# Patient Record
Sex: Female | Born: 1985 | Race: White | Hispanic: No | Smoking: Never smoker
Health system: Southern US, Community
[De-identification: ages and names within clinical notes are randomized; demographics above are authoritative.]

## PROBLEM LIST (undated history)

## (undated) ENCOUNTER — Inpatient Hospital Stay (HOSPITAL_COMMUNITY): Payer: Self-pay

## (undated) DIAGNOSIS — D6851 Activated protein C resistance: Secondary | ICD-10-CM

## (undated) DIAGNOSIS — J45909 Unspecified asthma, uncomplicated: Secondary | ICD-10-CM

## (undated) DIAGNOSIS — T7840XA Allergy, unspecified, initial encounter: Secondary | ICD-10-CM

## (undated) DIAGNOSIS — E282 Polycystic ovarian syndrome: Secondary | ICD-10-CM

## (undated) DIAGNOSIS — Z1371 Encounter for nonprocreative screening for genetic disease carrier status: Secondary | ICD-10-CM

## (undated) HISTORY — DX: Allergy, unspecified, initial encounter: T78.40XA

## (undated) HISTORY — DX: Polycystic ovarian syndrome: E28.2

## (undated) HISTORY — DX: Unspecified asthma, uncomplicated: J45.909

## (undated) HISTORY — PX: INGUINAL HERNIA REPAIR: SUR1180

## (undated) HISTORY — DX: Activated protein C resistance: D68.51

## (undated) HISTORY — DX: Encounter for nonprocreative screening for genetic disease carrier status: Z13.71

---

## 2017-02-05 DIAGNOSIS — C50919 Malignant neoplasm of unspecified site of unspecified female breast: Secondary | ICD-10-CM

## 2017-02-05 HISTORY — DX: Malignant neoplasm of unspecified site of unspecified female breast: C50.919

## 2017-02-05 HISTORY — PX: COMPLETE MASTECTOMY W/ SENTINEL NODE BIOPSY: SHX1383

## 2017-09-17 ENCOUNTER — Encounter: Payer: Self-pay | Admitting: Obstetrics and Gynecology

## 2017-09-17 ENCOUNTER — Ambulatory Visit (INDEPENDENT_AMBULATORY_CARE_PROVIDER_SITE_OTHER): Payer: BLUE CROSS/BLUE SHIELD | Admitting: Obstetrics and Gynecology

## 2017-09-17 VITALS — BP 119/86 | HR 72 | Wt 138.0 lb

## 2017-09-17 DIAGNOSIS — Z8742 Personal history of other diseases of the female genital tract: Secondary | ICD-10-CM

## 2017-09-17 DIAGNOSIS — N6452 Nipple discharge: Secondary | ICD-10-CM

## 2017-09-17 DIAGNOSIS — N6325 Unspecified lump in the left breast, overlapping quadrants: Secondary | ICD-10-CM

## 2017-09-17 DIAGNOSIS — Z1151 Encounter for screening for human papillomavirus (HPV): Secondary | ICD-10-CM

## 2017-09-17 DIAGNOSIS — Z01419 Encounter for gynecological examination (general) (routine) without abnormal findings: Secondary | ICD-10-CM

## 2017-09-17 DIAGNOSIS — Z124 Encounter for screening for malignant neoplasm of cervix: Secondary | ICD-10-CM

## 2017-09-17 DIAGNOSIS — N6320 Unspecified lump in the left breast, unspecified quadrant: Secondary | ICD-10-CM

## 2017-09-17 HISTORY — DX: Personal history of other diseases of the female genital tract: Z87.42

## 2017-09-17 HISTORY — DX: Unspecified lump in the left breast, overlapping quadrants: N63.25

## 2017-09-17 HISTORY — DX: Nipple discharge: N64.52

## 2017-09-17 NOTE — Progress Notes (Signed)
Obstetrics and Gynecology New Patient Annual Exam Evaluation  Appointment Date: 09/17/2017  OBGYN Clinic: [facility] for [facility]  Primary Care Provider: None  Referring Provider: Self  Chief Complaint:  Chief Complaint  Patient presents with  . New Gyn    Breast Leaking     History of Present Illness: Sandy Sullivan is a 32 y.o. Caucasian G0 (Patient's last menstrual period was 08/11/2017.), seen for the above chief complaint. Her past medical history is significant for PCOS, FVL heterozygote  GYN: no particular questions or issues today except below.   PCOS: patient wondering about getting a fasting insulin level and lipid panel b/c she had some high cholesterol in the past and pre-diabetes  Left breast: about 2 months ago she noticed her left breast had bloody discharge. No prior h/o of this and she only actually saw it bleeding yesterday. Also recently she noticed a lump on that right breast just above the nipple. She isn't sure if it's only coming from one duct, she denies any trauma to the area and it doesn't really sound tender or painful  She also occasionally has noticed sometimes she has a HA on the left temple and wonders if it could be due to jaw issues.  No chest pain, SOB, nausea, vomiting, abdominal pain, dysuria, hematuria, vaginal itching, dyspareunia except sometimes half way through her cycle, diarrhea, constipation, blood in BMs  Review of Systems:  as noted in the History of Present Illness.  Past Medical History:  Past Medical History:  Diagnosis Date  . Factor 5 Leiden mutation, heterozygous ([place])   . PCOS (polycystic ovarian syndrome)     Past Surgical History:  Past Surgical History:  Procedure Laterality Date  . INGUINAL HERNIA REPAIR Bilateral     Past Obstetrical History:  OB History  Gravida Para Term Preterm AB Living  0 0 0 0 0 0  SAB TAB Ectopic Multiple Live Births  0 0 0 0 0    Past Gynecological History: As  per HPI. Periods: qmonth, regular, 5 days, not particularly heavy or painful. Prior to this year she would only have a period 2x/year History of Pap Smear(s): Yes.   Last pap ?3 years ago, which was negative and no h/o abnormals History of STI(s): No. She is currently using condoms for contraception.   Social History:  Social History   Socioeconomic History  . Marital status: Significant Other    Spouse name: Not on file  . Number of children: Not on file  . Years of education: Not on file  . Highest education level: Not on file  Occupational History  . Not on file  Social Needs  . Financial resource strain: Not on file  . Food insecurity:    Worry: Not on file    Inability: Not on file  . Transportation needs:    Medical: Not on file    Non-medical: Not on file  Tobacco Use  . Smoking status: Never Smoker  . Smokeless tobacco: Never Used  Substance and Sexual Activity  . Alcohol use: Never    Frequency: Never  . Drug use: Never  . Sexual activity: Yes    Birth control/protection: Condom  Lifestyle  . Physical activity:    Days per week: Not on file    Minutes per session: Not on file  . Stress: Not on file  Relationships  . Social connections:    Talks on phone: Not on file    Gets together: Not on file  Attends religious service: Not on file    Active member of club or organization: Not on file    Attends meetings of clubs or organizations: Not on file    Relationship status: Not on file  . Intimate partner violence:    Fear of current or ex partner: Not on file    Emotionally abused: Not on file    Physically abused: Not on file    Forced sexual activity: Not on file  Other Topics Concern  . Not on file  Social History Narrative  . Not on file    Family History: History reviewed. No pertinent family history. She denies any female cancers except what sounds like breast cancer in her grandmother's sisters  Medications Damisha Castellanos had no  medications administered during this visit. No current outpatient medications on file.   No current facility-administered medications for this visit.     Allergies Aspirin   Physical Exam:  BP 119/86   Pulse 72   Wt 138 lb (62.6 kg)   LMP 08/11/2017  There is no height or weight on file to calculate BMI. General appearance: Well nourished, well developed female in no acute distress.  Neck:  Supple, normal appearance, and no thyromegaly  Cardiovascular: normal s1 and s2.  No murmurs, rubs or gallops. Respiratory:  Clear to auscultation bilateral. Normal respiratory effort Abdomen: positive bowel sounds and no masses, hernias; diffusely non tender to palpation, non distended Breasts: left breast normal except for only expression of the nipple I was able to express dark/wine colored blood from one duct at the 11 o'clock position of the nipple itself. At the 12 o'clock position approx 2-3cm above the nipple and just adjacent to the areola was a 5-12mm circumscribed, mobile, nttp lump.  Right breast exam and axilia: normal Neuro/Psych:  Normal mood and affect.  Skin:  Warm and dry.  Lymphatic:  No inguinal lymphadenopathy.   Pelvic exam: is not limited by body habitus EGBUS: within normal limits, Vagina: within normal limits and with no blood or discharge in the vault, Cervix: normal appearing cervix without tenderness, discharge or lesions. Uterus:  nonenlarged and non tender and Adnexa:  normal adnexa and no mass, fullness, tenderness Rectovaginal: deferred  Laboratory: none  Radiology: none  Assessment: pt stable  Plan:  1. History of PCOS D/w her re: GYN and non GYN issues with PCOS. Pt would like to get fasting insulin level and will come back for lab only visit. D/w her that if periods become irregular again to let us know as is risk for uterine hyperplasia. 73m d/w patient  2. Encounter for gynecological examination Routine care, pap done. Pt declines STI testing. Okay with  just condoms for [facility]   3. Bloody discharge from left nipple Will order basic labs and get a mammo and u/s of the left breast. 49m d/w patient   4. Breast lump on left side at 12 o'clock position  Orders Placed This Encounter  Procedures  . MM DIAG BREAST TOMO UNI LEFT  . US BREAST COMPLETE UNI LEFT INC AXILLA  . TSH  . Prolactin  . CMP and Liver  . Beta hCG quant (ref lab)  . Insulin, random  . Lipid panel  . Hemoglobin A1c  . CBC    RTC PRN  Durene Romans MD Attending Center for [facility])

## 2017-09-17 NOTE — Progress Notes (Signed)
Left breast leaking x 30month

## 2017-09-18 ENCOUNTER — Other Ambulatory Visit: Payer: BLUE CROSS/BLUE SHIELD

## 2017-09-18 ENCOUNTER — Encounter: Payer: Self-pay | Admitting: Obstetrics and Gynecology

## 2017-09-18 DIAGNOSIS — N6452 Nipple discharge: Secondary | ICD-10-CM

## 2017-09-18 NOTE — Addendum Note (Signed)
Addended by: Phillip Heal, DEMETRICE A on: 09/18/2017 10:22 AM   Modules accepted: Orders

## 2017-09-19 ENCOUNTER — Other Ambulatory Visit: Payer: Self-pay | Admitting: Radiology

## 2017-09-19 HISTORY — PX: BREAST BIOPSY: SHX20

## 2017-09-19 LAB — INSULIN, RANDOM: INSULIN: 4.4 u[IU]/mL (ref 2.6–24.9)

## 2017-09-19 LAB — LIPID PANEL
Chol/HDL Ratio: 3.3 ratio (ref 0.0–4.4)
Cholesterol, Total: 177 mg/dL (ref 100–199)
HDL: 54 mg/dL (ref >39)
LDL Calculated: 109 mg/dL — ABNORMAL HIGH (ref 0–99)
TRIGLYCERIDES: 70 mg/dL (ref 0–149)
VLDL Cholesterol Cal: 14 mg/dL (ref 5–40)

## 2017-09-19 LAB — CBC
HEMOGLOBIN: 14.3 g/dL (ref 11.1–15.9)
Hematocrit: 42.4 % (ref 34.0–46.6)
MCH: 31.5 pg (ref 26.6–33.0)
MCHC: 33.7 g/dL (ref 31.5–35.7)
MCV: 93 fL (ref 79–97)
Platelets: 240 10*3/uL (ref 150–450)
RBC: 4.54 x10E6/uL (ref 3.77–5.28)
RDW: 12.8 % (ref 12.3–15.4)
WBC: 6.2 10*3/uL (ref 3.4–10.8)

## 2017-09-19 LAB — CYTOLOGY - PAP
Adequacy: ABSENT
Diagnosis: NEGATIVE
HPV (WINDOPATH): NOT DETECTED

## 2017-09-19 LAB — CMP AND LIVER
ALK PHOS: 50 IU/L (ref 39–117)
ALT: 12 IU/L (ref 0–32)
AST: 13 IU/L (ref 0–40)
Albumin: 4.5 g/dL (ref 3.5–5.5)
BUN: 13 mg/dL (ref 6–20)
Bilirubin Total: 0.7 mg/dL (ref 0.0–1.2)
Bilirubin, Direct: 0.19 mg/dL (ref 0.00–0.40)
CO2: 24 mmol/L (ref 20–29)
Calcium: 9.1 mg/dL (ref 8.7–10.2)
Chloride: 104 mmol/L (ref 96–106)
Creatinine, Ser: 0.67 mg/dL (ref 0.57–1.00)
GFR calc Af Amer: 136 mL/min/{1.73_m2} (ref >59)
GFR calc non Af Amer: 118 mL/min/{1.73_m2} (ref >59)
Glucose: 89 mg/dL (ref 65–99)
Potassium: 4.1 mmol/L (ref 3.5–5.2)
Sodium: 140 mmol/L (ref 134–144)
Total Protein: 7 g/dL (ref 6.0–8.5)

## 2017-09-19 LAB — HEMOGLOBIN A1C
Est. average glucose Bld gHb Est-mCnc: 108 mg/dL
Hgb A1c MFr Bld: 5.4 % (ref 4.8–5.6)

## 2017-09-19 LAB — BETA HCG QUANT (REF LAB)

## 2017-09-19 LAB — PROLACTIN: PROLACTIN: 22.4 ng/mL (ref 4.8–23.3)

## 2017-09-19 LAB — TSH: TSH: 1.58 u[IU]/mL (ref 0.450–4.500)

## 2017-09-20 ENCOUNTER — Telehealth: Payer: Self-pay | Admitting: Obstetrics and Gynecology

## 2017-09-20 DIAGNOSIS — D0512 Intraductal carcinoma in situ of left breast: Secondary | ICD-10-CM

## 2017-09-20 DIAGNOSIS — Z86000 Personal history of in-situ neoplasm of breast: Secondary | ICD-10-CM | POA: Insufficient documentation

## 2017-09-20 NOTE — Telephone Encounter (Signed)
GYN Telephone Note Patient called at [phone] and VM identified it as her. VM left for her to call the clinic. I'm on call today and I told her she can call the on call phone at any time.  Durene Romans MD Attending Center for [facility] (Faculty Practice) 09/20/2017 Time: [phone]

## 2017-09-20 NOTE — Telephone Encounter (Signed)
GYN Telephone Note Patient called me back and she heard back re: her pathology results already. She lives in [place] and doesn't have a preference for site of care. I told her that I recommend being seen at [facility] or at [facility] in [place], just based on who can see her first. She stated that the radiology mentioned an MRI and I told her the best thing is to get her set up with a breast surgeon asap and they can decide on what next steps are. I told her that if they want any imaging or blood work before they see her then we can do that. Pt states she has open availability and will be back on Sunday 8/18.   Durene Romans MD Attending Center for [facility] (Faculty Practice) 09/20/2017 Time: [phone]

## 2017-09-23 ENCOUNTER — Other Ambulatory Visit: Payer: Self-pay | Admitting: Radiology

## 2017-09-23 ENCOUNTER — Telehealth: Payer: Self-pay

## 2017-09-23 DIAGNOSIS — N63 Unspecified lump in unspecified breast: Secondary | ICD-10-CM

## 2017-09-23 DIAGNOSIS — D0512 Intraductal carcinoma in situ of left breast: Secondary | ICD-10-CM

## 2017-09-23 NOTE — Telephone Encounter (Signed)
Patient has been informed of test results and referral has been placed to breast surgeon Dr.Burnett

## 2017-09-24 ENCOUNTER — Ambulatory Visit (INDEPENDENT_AMBULATORY_CARE_PROVIDER_SITE_OTHER): Payer: BLUE CROSS/BLUE SHIELD | Admitting: General Surgery

## 2017-09-24 ENCOUNTER — Encounter: Payer: Self-pay | Admitting: General Surgery

## 2017-09-24 VITALS — BP 106/60 | HR 73 | Resp 14 | Ht 64.0 in | Wt 136.0 lb

## 2017-09-24 DIAGNOSIS — D0512 Intraductal carcinoma in situ of left breast: Secondary | ICD-10-CM

## 2017-09-24 NOTE — Patient Instructions (Signed)
The patient is aware to call back for any questions or new concerns.  

## 2017-09-24 NOTE — Progress Notes (Signed)
Patient ID: Sandy Sullivan, female   DOB: 08/05/1985, 32 y.o.   MRN: [id number]  Chief Complaint  Patient presents with  . Follow-up    HPI Sandy Sullivan is a 32 y.o. female.  who presents for a breast evaluation referred by Dr. Ilda Basset. The most recent mammogram was done on 09-18-17.  Left breast ultrasound and biopsy was 09-19-17. Patient does not perform regular self breast checks. She states she had bloody discharge for about a month but didn't realize it was from her nipple until Monday 09-16-17. It was then that she felt the lump as well. Denies any breast injury or trauma. 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 She is here with her significant other of 7 years, [name].  MRI breast scheduled for tomorrow.  HPI  Past Medical History:  Diagnosis Date  . Allergy   . Asthma    exercised inducted  . Factor 5 Leiden mutation, heterozygous ([place])   . PCOS (polycystic ovarian syndrome)     Past Surgical History:  Procedure Laterality Date  . BREAST BIOPSY Left 09/19/2017   DUCTAL CARCINOMA IN SITU WITH CALCIFICATIONS  . INGUINAL HERNIA REPAIR Bilateral    child    Family History  Problem Relation Age of Onset  . Breast cancer Maternal Aunt        [employer]  . Colon cancer Neg Hx     Social History Social History   Tobacco Use  . Smoking status: Never Smoker  . Smokeless tobacco: Never Used  Substance Use Topics  . Alcohol use: Never    Frequency: Never  . Drug use: Never    Allergies  Allergen Reactions  . Sulfa Antibiotics Hives and Swelling  . Aspirin Hives  . Ibuprofen Hives    No current outpatient medications on file.   No current facility-administered medications for this visit.     Review of Systems Review of Systems  Constitutional: Negative.   Respiratory: Negative.   Cardiovascular: Negative.     Blood pressure 106/60, pulse 73, resp. rate 14, height '5\' 4"'$  (1.626 m), weight 136 lb (61.7 kg), last menstrual period 08/11/2017, SpO2 99  %.  Physical Exam Physical Exam  Constitutional: She is oriented to person, place, and time. She appears well-developed and well-nourished.  HENT:  Mouth/Throat: Oropharynx is clear and moist.  Eyes: Conjunctivae are normal. No scleral icterus.  Neck: Neck supple.  Cardiovascular: Normal rate, regular rhythm and normal heart sounds.  Pulmonary/Chest: Effort normal and breath sounds normal. Right breast exhibits no inverted nipple, no mass, no nipple discharge, no skin change and no tenderness. Left breast exhibits skin change. Left breast exhibits no inverted nipple, no mass, no nipple discharge and no tenderness.  Bilateral nipple crease. Bruising at biopsy site left breast.    Lymphadenopathy:    She has no cervical adenopathy.    She has no axillary adenopathy.  Neurological: She is alert and oriented to person, place, and time.  Skin: Skin is warm and dry.  Psychiatric: Her behavior is normal.    Data Reviewed  Bilateral diagnostic mammograms and left breast ultrasound dated September 18, 2017 were completed at [facility] in [place] and are not available for review today.  A disc been requested.  Radiologist does describe a 9 mm irregular mass in the left breast in the upper middle aspect.  Diagnosis Breast, left, needle core biopsy - DUCTAL CARCINOMA IN SITU WITH CALCIFICATIONS. - SEE COMMENT. PROGNOSTIC INDICATORS Results: IMMUNOHISTOCHEMICAL AND MORPHOMETRIC ANALYSIS PERFORMED MANUALLY Estrogen Receptor:  100%, POSITIVE, STRONG STAINING INTENSITY Progesterone Receptor: 80%, POSITIVE, STRONG STAINING INTENSITY FINAL DIAGNOSIS Mammograms and ultrasound were completed at [facility] Microscopic Comment The carcinoma appears high grade and may be involving an underlying papillary lesion.    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      Plan    The finding of DCIS in the papilloma is not particularly atypical, although the patient is quite  young.  She has been scheduled for an MRI tomorrow, but has asked to postpone this until after 1 September when her insurance changes and she thinks it will be less expensive.  I see no risk associated with this move.  Based on her young age, I have recommended genetic testing as this could impact options for management.  She has fairly small breast A/B cup at best, and a local excision of the known area could be undertaken but if there was more widely extensive disease or additional findings in other quadrants this would be less than ideal.  Obviously, if BRCA1/2 positive bilateral mastectomy would be the best choice.  We spent about 45 minutes reviewing where we are and that in spite of what she is been told this is not a rapidly progressive disease and that she will not die from the disease we know about at this time.  The patient is amenable to genetic testing and this will be obtained in the near future and the MRI will be rescheduled after September 1 per her request.  Release signed and faxed for mammogram images and disc.      HPI, Physical Exam, Assessment and Plan have been scribed under the direction and in the presence of Robert Bellow, MD. Karie Fetch, RN  I have completed the exam and reviewed the above documentation for accuracy and completeness.  I agree with the above.  Haematologist has been used and any errors in dictation or transcription are unintentional.  Hervey Ard, M.D., F.A.C.S.  Forest Gleason Rayford Williamsen 09/25/2017, 5:41 PM

## 2017-09-25 ENCOUNTER — Ambulatory Visit: Payer: BLUE CROSS/BLUE SHIELD | Admitting: *Deleted

## 2017-09-25 ENCOUNTER — Telehealth: Payer: Self-pay | Admitting: *Deleted

## 2017-09-25 ENCOUNTER — Other Ambulatory Visit: Payer: BLUE CROSS/BLUE SHIELD

## 2017-09-25 ENCOUNTER — Encounter: Payer: Self-pay | Admitting: Radiology

## 2017-09-25 DIAGNOSIS — D0512 Intraductal carcinoma in situ of left breast: Secondary | ICD-10-CM

## 2017-09-25 DIAGNOSIS — Z1371 Encounter for nonprocreative screening for genetic disease carrier status: Secondary | ICD-10-CM

## 2017-09-25 HISTORY — DX: Encounter for nonprocreative screening for genetic disease carrier status: Z13.71

## 2017-09-25 NOTE — Telephone Encounter (Signed)
Pt needs nurse appointment for genetic testing

## 2017-09-25 NOTE — Telephone Encounter (Signed)
Patient is coming in 09/25/2017 @ 11:30am

## 2017-09-25 NOTE — Progress Notes (Signed)
Patient came in today for genetic testing.  Completed and send

## 2017-09-25 NOTE — Telephone Encounter (Signed)
Patient called again and wants you to call her back

## 2017-09-25 NOTE — Telephone Encounter (Signed)
Genetic testing 09-26-17, aware of prep, no eating/drinking 1 hour before testing.

## 2017-09-26 ENCOUNTER — Ambulatory Visit: Payer: BLUE CROSS/BLUE SHIELD

## 2017-09-27 ENCOUNTER — Other Ambulatory Visit (HOSPITAL_COMMUNITY): Payer: Self-pay | Admitting: Radiology

## 2017-09-27 DIAGNOSIS — D0512 Intraductal carcinoma in situ of left breast: Secondary | ICD-10-CM

## 2017-10-08 ENCOUNTER — Ambulatory Visit (HOSPITAL_COMMUNITY): Admission: RE | Admit: 2017-10-08 | Payer: BLUE CROSS/BLUE SHIELD | Source: Ambulatory Visit

## 2017-10-09 ENCOUNTER — Ambulatory Visit (HOSPITAL_COMMUNITY)
Admission: RE | Admit: 2017-10-09 | Discharge: 2017-10-09 | Disposition: A | Discharge status: Home / Self Care | Payer: BC Managed Care – PPO | Source: Ambulatory Visit | Attending: Radiology | Admitting: Radiology

## 2017-10-09 DIAGNOSIS — D0512 Intraductal carcinoma in situ of left breast: Secondary | ICD-10-CM | POA: Diagnosis present

## 2017-10-09 MED ORDER — GADOBENATE DIMEGLUMINE 529 MG/ML IV SOLN
15.0000 mL | Freq: Once | INTRAVENOUS | Status: AC | PRN
  Administered 2017-10-09: 12 mL via INTRAVENOUS

## 2017-10-15 ENCOUNTER — Telehealth: Payer: Self-pay | Admitting: General Surgery

## 2017-10-15 ENCOUNTER — Encounter: Payer: Self-pay | Admitting: General Surgery

## 2017-10-15 ENCOUNTER — Telehealth: Payer: Self-pay | Admitting: *Deleted

## 2017-10-15 NOTE — Telephone Encounter (Signed)
Patient called and just wanted to let you know that she has chosen to go to [facility]

## 2017-10-15 NOTE — Telephone Encounter (Signed)
The patient was contacted with results of her in detail testing.  This showed no evidence of known genetic variation.  Bilateral MRI dated October 04, 2017 had showed no disease in the right breast.  She had sent a message to Dr. Ilda Basset with her mother's request for oncologic evaluation. Had failed to show With DCIS, there would be no indication for chemotherapy, and while the possibility of finding invasive cancer is present, early evaluation by medical oncology would not change her options for management.  I had reviewed her original mammograms completed at [facility] in [place], and she does have a very large extensive area of microcalcifications which I think would preclude breast conservation.  We discussed the potential for mastectomy with immediate reconstruction, and she was amenable to meeting with the plastic surgery service.  There was some mention about [facility], and the patient reported this was mainly for oncologic evaluation rather than surgical consultation.  This option was left open at her request.  We will arrange for evaluation with Audelia Hives, DO for her assessment of candidacy for immediate reconstruction.

## 2017-10-16 ENCOUNTER — Other Ambulatory Visit: Payer: Self-pay | Admitting: Radiology

## 2017-10-16 DIAGNOSIS — R9389 Abnormal findings on diagnostic imaging of other specified body structures: Secondary | ICD-10-CM

## 2017-10-23 ENCOUNTER — Ambulatory Visit
Admission: RE | Admit: 2017-10-23 | Discharge: 2017-10-23 | Disposition: A | Discharge status: Home / Self Care | Payer: BC Managed Care – PPO | Source: Ambulatory Visit | Attending: Radiology | Admitting: Radiology

## 2017-10-23 DIAGNOSIS — R9389 Abnormal findings on diagnostic imaging of other specified body structures: Secondary | ICD-10-CM

## 2017-10-23 MED ORDER — GADOBENATE DIMEGLUMINE 529 MG/ML IV SOLN
15.0000 mL | Freq: Once | INTRAVENOUS | Status: AC | PRN
  Administered 2017-10-23: 15 mL via INTRAVENOUS

## 2017-10-24 ENCOUNTER — Telehealth: Payer: Self-pay | Admitting: *Deleted

## 2017-10-24 NOTE — Telephone Encounter (Signed)
Sandy Sullivan left a voice message this am stating she had 2 biopsies done at MRI at [place] yesterday and she is unable to reach them. States she wants to know the results. Per chart is not a patient in our office; but is at [place]- will forward to that office.

## 2017-10-31 ENCOUNTER — Encounter: Payer: Self-pay | Admitting: Radiology

## 2017-11-04 NOTE — Telephone Encounter (Signed)
Call to [place] about referral to Dr Audelia Hives for plastic surgery/reconstruction. She has spoken to the patient and she has decided to go with a Veterinary surgeon. She will call back if she changes her mind.

## 2017-11-26 ENCOUNTER — Encounter: Payer: Self-pay | Admitting: *Deleted

## 2017-12-17 ENCOUNTER — Other Ambulatory Visit
Admission: RE | Admit: 2017-12-17 | Discharge: 2017-12-17 | Disposition: A | Discharge status: Home / Self Care | Payer: BC Managed Care – PPO | Source: Ambulatory Visit | Attending: Internal Medicine | Admitting: Internal Medicine

## 2017-12-17 DIAGNOSIS — C50919 Malignant neoplasm of unspecified site of unspecified female breast: Secondary | ICD-10-CM | POA: Insufficient documentation

## 2017-12-17 LAB — C-REACTIVE PROTEIN: CRP: <0.8 mg/dL (ref <1.0)

## 2017-12-17 LAB — SEDIMENTATION RATE: Sed Rate: 5 mm/hr (ref 0–20)

## 2017-12-18 LAB — HCV RNA QUANT: HCV Quantitative: NOT DETECTED IU/mL (ref >50)

## 2017-12-18 LAB — CANCER ANTIGEN 15-3: CA 15-3: 12.6 U/mL (ref 0.0–25.0)

## 2018-02-12 ENCOUNTER — Encounter: Payer: Self-pay | Admitting: Radiology

## 2018-02-24 ENCOUNTER — Encounter: Payer: Self-pay | Admitting: Radiology

## 2018-06-18 ENCOUNTER — Ambulatory Visit: Payer: BC Managed Care – PPO | Admitting: Obstetrics and Gynecology

## 2018-06-24 ENCOUNTER — Ambulatory Visit (INDEPENDENT_AMBULATORY_CARE_PROVIDER_SITE_OTHER): Payer: BC Managed Care – PPO | Admitting: Obstetrics and Gynecology

## 2018-06-24 ENCOUNTER — Encounter: Payer: Self-pay | Admitting: Obstetrics and Gynecology

## 2018-06-24 ENCOUNTER — Other Ambulatory Visit: Payer: Self-pay

## 2018-06-24 DIAGNOSIS — D0510 Intraductal carcinoma in situ of unspecified breast: Secondary | ICD-10-CM | POA: Diagnosis not present

## 2018-06-24 DIAGNOSIS — E282 Polycystic ovarian syndrome: Secondary | ICD-10-CM | POA: Diagnosis not present

## 2018-06-24 DIAGNOSIS — Z3162 Encounter for fertility preservation counseling: Secondary | ICD-10-CM | POA: Diagnosis not present

## 2018-06-24 NOTE — Progress Notes (Signed)
   TELEHEALTH VIRTUAL GYNECOLOGY VISIT ENCOUNTER NOTE  I connected with Sandy Sullivan on 06/26/18 at  3:30 PM EDT by telephone at home and verified that I am speaking with the correct person using two identifiers.   I discussed the limitations, risks, security and privacy concerns of performing an evaluation and management service by telephone and the availability of in person appointments. I also discussed with the patient that there may be a patient responsible charge related to this service. The patient expressed understanding and agreed to proceed.  Obstetrics and Gynecology Visit Return Patient Evaluation  Appointment Date: 06/24/2018  Primary Care Provider: Cletis Athens  OBGYN Clinic: [facility]  Chief Complaint: PCOS, breast cancer questions  History of Present Illness:  Sandy Sullivan is a 33 y.o. with the above CC  Breast Cancer Patient has some concerns re: her tx at [facility], specifically wondering about needs for surveillance scans now that her primary treatment is done. She would like a referral to another medical oncologist    GYN Dyspareunia occasionally with deeper penetration Periods: last one was approx two months. Patient only received surgery and radiation and hasn't started adjuvant tx yet, which is supposed to be goserelin   Review of Systems: as noted in the History of Present Illness.  Medications:  Sandy Sullivan had no medications administered during this visit. No current outpatient medications on file.   No current facility-administered medications for this visit.     Allergies: is allergic to sulfa antibiotics; aspirin; and ibuprofen.  Physical Exam:  There were no vitals taken for this visit. There is no height or weight on file to calculate BMI. General appearance: Well nourished, well developed female in no acute distress.    Assessment: pt doing well  Plan: I told her that with her being on ovarian  suppression with Zoladex should suppress her endometrium so not having is period when she starts the medication and should also be protective against uterine malignancy. Patient told to be aware of menopausal side effects likely with Zoladex, such as vaginal dryness, issues with intercourse. I also told her that the medication shouldn't have issues in regards to her getting pregnant in the future, with plans to maybe come off the medication in two years and try for pregnancy; she states they are getting a DEXA scan on her before starting the medication.    I offered her a 2nd opinion medical oncology appointment and she was amenable to this and would like to start with someone in the [facility]  RTC: prn  I provided 30 minutes of non-face-to-face time during this encounter. The visit was conducted via MyChart Virtual-medicine   Durene Romans MD Attending Center for [facility])

## 2018-06-24 NOTE — Progress Notes (Signed)
I connected with  Karem Farha on 06/24/18 at  3:30 PM EDT by telephone and verified that I am speaking with the correct person using two identifiers.   I discussed the limitations, risks, security and privacy concerns of performing an evaluation and management service by telephone and the availability of in person appointments. I also discussed with the patient that there may be a patient responsible charge related to this service. The patient expressed understanding and agreed to proceed.  Demetrice Jeanella Anton, [place] 06/24/2018  3:15 PM

## 2018-07-02 ENCOUNTER — Encounter: Payer: Self-pay | Admitting: *Deleted

## 2018-07-02 NOTE — Progress Notes (Signed)
Received a request from Dr Ilda Basset requesting that we see this patient for a second opinion for her DCIS.   I have made several attempts to contact patient and left two voicemail messages on her personal voice mail.   Will send a My Chart message and await for patient to return my attempts at contact.

## 2018-07-03 ENCOUNTER — Other Ambulatory Visit: Payer: Self-pay | Admitting: Obstetrics and Gynecology

## 2018-07-03 DIAGNOSIS — D0512 Intraductal carcinoma in situ of left breast: Secondary | ICD-10-CM

## 2019-10-27 ENCOUNTER — Ambulatory Visit: Payer: BC Managed Care – PPO | Admitting: Family Medicine

## 2019-10-28 ENCOUNTER — Other Ambulatory Visit (HOSPITAL_COMMUNITY)
Admission: RE | Admit: 2019-10-28 | Discharge: 2019-10-28 | Disposition: A | Discharge status: Home / Self Care | Payer: BC Managed Care – PPO | Source: Ambulatory Visit | Attending: Obstetrics and Gynecology | Admitting: Obstetrics and Gynecology

## 2019-10-28 ENCOUNTER — Other Ambulatory Visit: Payer: Self-pay

## 2019-10-28 ENCOUNTER — Ambulatory Visit (INDEPENDENT_AMBULATORY_CARE_PROVIDER_SITE_OTHER): Payer: BC Managed Care – PPO | Admitting: Obstetrics and Gynecology

## 2019-10-28 ENCOUNTER — Encounter: Payer: Self-pay | Admitting: Obstetrics and Gynecology

## 2019-10-28 VITALS — BP 115/79 | HR 75 | Ht 64.0 in | Wt 148.0 lb

## 2019-10-28 DIAGNOSIS — R102 Pelvic and perineal pain unspecified side: Secondary | ICD-10-CM

## 2019-10-28 DIAGNOSIS — Z124 Encounter for screening for malignant neoplasm of cervix: Secondary | ICD-10-CM | POA: Diagnosis not present

## 2019-10-28 DIAGNOSIS — N941 Unspecified dyspareunia: Secondary | ICD-10-CM

## 2019-10-28 DIAGNOSIS — Z01419 Encounter for gynecological examination (general) (routine) without abnormal findings: Secondary | ICD-10-CM | POA: Diagnosis not present

## 2019-10-28 DIAGNOSIS — Z1239 Encounter for other screening for malignant neoplasm of breast: Secondary | ICD-10-CM

## 2019-10-28 NOTE — Progress Notes (Signed)
Gynecology Annual Exam   PCP: Cletis Athens, MD  Chief Complaint:  Chief Complaint  Patient presents with  . Gynecologic Exam    appetite but feels full with sm. bites/ PCOS effects on intercourse    History of Present Illness: Patient is a 34 y.o. G0P0000 presents for annual exam. The patient has no complaints today.   LMP: No LMP recorded. (Menstrual status: Irregular Periods). Absent secondary to Depo Lupron. No bleeding noted.  Is doing well from a vasomotor standpoint.   The patient is sexually active. She currently uses none for contraception (medical induced menopause). She has dyspareunia.  Report intermittent deep pelvic pain during intercourse.  The patient does perform self breast exams.  There is no notable family history of breast or ovarian cancer in her family, but a notable personal history of DCIS.  The patient wears seatbelts: yes.   The patient has regular exercise: not asked.    The patient denies current symptoms of depression.    Review of Systems: Review of Systems  Constitutional: Negative.  Negative for chills and fever.  HENT: Negative for congestion.   Respiratory: Negative for cough and shortness of breath.   Cardiovascular: Negative for chest pain and palpitations.  Gastrointestinal: Negative for abdominal pain, constipation, diarrhea, heartburn, nausea and vomiting.  Genitourinary: Negative for dysuria, frequency and urgency.  Skin: Negative for itching and rash.  Neurological: Negative for dizziness and headaches.  Endo/Heme/Allergies: Negative for polydipsia.  Psychiatric/Behavioral: Negative for depression.    Past Medical History:  Patient Active Problem List   Diagnosis Date Noted  . Ductal carcinoma in situ (DCIS) of left breast 09/20/2017    S/p 09/19/17 biopsy   . History of PCOS 09/17/2017  . Bloody discharge from left nipple 09/17/2017  . Breast lump on left side at 12 o'clock position 09/17/2017    Past Surgical History:    Past Surgical History:  Procedure Laterality Date  . BREAST BIOPSY Left 09/19/2017   DUCTAL CARCINOMA IN SITU WITH CALCIFICATIONS  . COMPLETE MASTECTOMY W/ SENTINEL NODE BIOPSY  2019  . INGUINAL HERNIA REPAIR Bilateral    child    Gynecologic History:  No LMP recorded. (Menstrual status: Irregular Periods). Contraception: post menopausal status Last Pap: Results were:09/17/2017 no abnormalities   Obstetric History: G0P0000  Family History:  Family History  Problem Relation Age of Onset  . Breast cancer Maternal Aunt        [employer]  . Colon cancer Neg Hx     Social History:  Social History   Socioeconomic History  . Marital status: Significant Other    Spouse name: Not on file  . Number of children: Not on file  . Years of education: Not on file  . Highest education level: Not on file  Occupational History  . Not on file  Tobacco Use  . Smoking status: Never Smoker  . Smokeless tobacco: Never Used  Substance and Sexual Activity  . Alcohol use: Never  . Drug use: Never  . Sexual activity: Yes    Birth control/protection: None  Other Topics Concern  . Not on file  Social History Narrative  . Not on file   Social Determinants of Health   Financial Resource Strain:   . Difficulty of Paying Living Expenses: Not on file  Food Insecurity:   . Worried About Charity fundraiser in the Last Year: Not on file  . Ran Out of Food in the Last Year: Not on file  Transportation Needs:   . Film/video editor (Medical): Not on file  . Lack of Transportation (Non-Medical): Not on file  Physical Activity:   . Days of Exercise per Week: Not on file  . Minutes of Exercise per Session: Not on file  Stress:   . Feeling of Stress : Not on file  Social Connections:   . Frequency of Communication with Friends and Family: Not on file  . Frequency of Social Gatherings with Friends and Family: Not on file  . Attends Religious Services: Not on file  . Active Member of Clubs  or Organizations: Not on file  . Attends Archivist Meetings: Not on file  . Marital Status: Not on file  Intimate Partner Violence:   . Fear of Current or Ex-Partner: Not on file  . Emotionally Abused: Not on file  . Physically Abused: Not on file  . Sexually Abused: Not on file    Allergies:  Allergies  Allergen Reactions  . Sulfa Antibiotics Hives and Swelling  . Aspirin Hives  . Ibuprofen Hives    Medications: Prior to Admission medications   Medication Sig Start Date End Date Taking? Authorizing Provider  goserelin (ZOLADEX) 10.8 MG injection goserelin 10.8 mg subcutaneous implant  Inject 1 mg every 3 months by subcutaneous route.   Yes [provider]  letrozole (FEMARA) 2.5 MG tablet Take 2.5 mg by mouth daily. 06/22/19  Yes [provider]    Physical Exam Vitals: Blood pressure 115/79, pulse 75, height $RemoveBe'5\' 4"'EMTKhQvSE$  (1.626 m), weight 148 lb (67.1 kg).  General: NAD HEENT: normocephalic, anicteric Thyroid: no enlargement, no palpable nodules Pulmonary: No increased work of breathing, CTAB Cardiovascular: RRR, distal pulses 2+ Breast: Status post bilateral mastectomy with reconstruction. Breast symmetrical, no tenderness, normal postoperative changes.  No axillary or supraclavicular lymphadenopathy. Abdomen: NABS, soft, non-tender, non-distended.  Umbilicus without lesions.  No hepatomegaly, splenomegaly or masses palpable. No evidence of hernia  Genitourinary:  External: Normal external female genitalia.  Normal urethral meatus, normal Bartholin's and Skene's glands.    Vagina: Normal vaginal mucosa, no evidence of prolapse.    Cervix: Grossly normal in appearance, no bleeding  Uterus: Non-enlarged, mobile, normal contour.  No CMT  Adnexa: ovaries non-enlarged, no adnexal masses  Rectal: deferred  Lymphatic: no evidence of inguinal lymphadenopathy Extremities: no edema, erythema, or tenderness Neurologic: Grossly intact Psychiatric: mood  appropriate, affect full  Female chaperone present for pelvic and breast  portions of the physical exam    Assessment: 34 y.o. G0P0000 routine annual exam  Plan: Problem List Items Addressed This Visit    None    Visit Diagnoses    Encounter for gynecological examination without abnormal finding    -  Primary   Screening for malignant neoplasm of cervix       Relevant Orders   Cytology - PAP   Breast screening       Dyspareunia, female       Relevant Orders   US Transvaginal Non-OB   Pelvic pain       Relevant Orders   US Transvaginal Non-OB      2) STI screening  was notoffered and therefore not obtained  2)  ASCCP guidelines and rational discussed.  Patient opts for yearly screening interval  3) Contraception - the patient is currently using  post menopausal status.  She is happy with her current form of contraception and plans to continue In women who are premenopausal at diagnosis, the NCCN Panel recommends tamoxifen  treatment with or without ovarian suppression/ablation.  Ovarian ablation may be accomplished by surgical oophorectomy or by ovarian irradiation.  4) Breast cancer history (BRCA negative) - In two randomized trials (TEXT and SOFT), premenopausal women with hormone receptor-positive early-stage breast cancer showed statistical improvements in disease free survival.  For patients receiving the aromatase inhibitor exemestane plus ovarian suppression significantly reduces recurrences as compared with tamoxifen plus ovarian suppression.    NCCN Guidelines Breast Cancer Version 3.2019  - we discussed that medical suppression is probably comparable to surgical suppression via oophorectomy, and in the setting of BRCA negative status BSO would not necessarily confer any additional benefit but remains an option.  - discussed early DEXA scan given ovarian suppression  4) Routine healthcare maintenance including cholesterol, diabetes screening discussed managed by  PCP  5) Dypsarunia - previously noted at a visit with Dr. Ilda Basset.  Normal bimanual exam today but will obtain TVUS to evaluate for any gyn etiology.  We discussed that PCOS is really a disease most commonly seen with patient actively menstruating not menstruating regularly and undergoing abnormal folicular development.  However, this should not be an issue while on ovarian suppression therapy.  In addition other common etiologies for pelvic pain and dyspareunia are commonly treated with [facility] agonist/antagonists.  6) Return in about 1 week (around 11/04/2019) for TVUS and gyn follow up.   Malachy Mood, MD, [facility], [facility] 10/28/2019, 11:19 AM

## 2019-10-30 ENCOUNTER — Ambulatory Visit (INDEPENDENT_AMBULATORY_CARE_PROVIDER_SITE_OTHER): Payer: BC Managed Care – PPO

## 2019-10-30 ENCOUNTER — Other Ambulatory Visit: Payer: Self-pay

## 2019-10-30 ENCOUNTER — Telehealth: Payer: Self-pay | Admitting: Obstetrics and Gynecology

## 2019-10-30 DIAGNOSIS — N941 Unspecified dyspareunia: Secondary | ICD-10-CM

## 2019-10-30 DIAGNOSIS — R102 Pelvic and perineal pain unspecified side: Secondary | ICD-10-CM

## 2019-10-30 LAB — CYTOLOGY - PAP
Comment: NEGATIVE
Diagnosis: NEGATIVE
High risk HPV: NEGATIVE

## 2019-10-30 NOTE — Telephone Encounter (Signed)
Patient was scheduled back for ultrasound but no follow up scheduled, if she could just be called with results?  Patient states she has trouble getting time off.  Please advise.

## 2019-12-02 IMAGING — MR MR BREAST BIOPSY
6 of 8 series · 32 of 48 positions shown · IV contrast (15ml Multihance)
Comparison: Previous exams.

ADDENDUM:
Pathology revealed INTERMEDIATE GRADE DUCTAL CARCINOMA IN SITU WITH
CALCIFICATIONS of LEFT breast, upper inner quadrant. This was found
to be concordant by Dr. Aristan Verner.

CALCIFICATIONS of LEFT breast, upper inner quadrant. This is
morphologically similar to that in part 1. This was found to be
concordant by Dr. Aristan Verner.
Pathology results were discussed with the patient by telephone. The
patient reported doing well after the biopsy with tenderness at the
site. Post biopsy instructions and care were reviewed and questions
were answered. The patient was encouraged to call The [REDACTED]
The patient has a recent diagnosis of LEFT breast cancer and should
follow her outlined treatment plan. The patient has an appointment
for surgical consultation with Menna, Anne-Maarit[[email or web address]], on
10/25/2017.
Pathology results reported by Jacob Huaman, RN on 10/24/2017.
CLINICAL DATA: Patient presents for MRI guided biopsy of 2 areas of
non mass enhancement in the left breast. Patient underwent
ultrasound-guided core needle biopsy of the left breast, which
revealed carcinoma. Subsequent staging breast MRI showed a large
area of non mass enhancement in the left breast, occupying most of
the upper inner quadrant. Two additional areas of the non mass
enhancement were biopsied to assess extent of disease.
EXAM:
MRI GUIDED CORE NEEDLE BIOPSY OF THE LEFT BREAST: 2 LESIONS
BIOPSIED.
TECHNIQUE: Multiplanar, multisequence MR imaging of the left breast was
performed both before and after administration of intravenous
contrast.
CONTRAST:  15mL MULTIHANCE GADOBENATE DIMEGLUMINE 529 MG/ML IV SOLN

[Series 2: fiducial unilateral · sagittal · 2.0mm · 1.33mm/px · 1 of 52 slices shown]
[im 1/52]
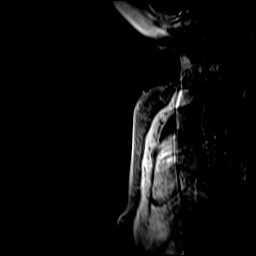

[Series 3: dynamic pre · axial · non-contrast · 1.3mm · 0.73mm/px · z∈[-128,+79]mm · 6 of 160 slices shown]
[im 1/160]
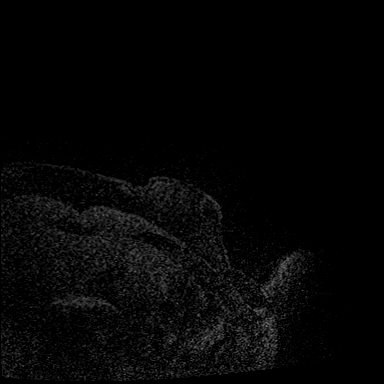
[im 32/160]
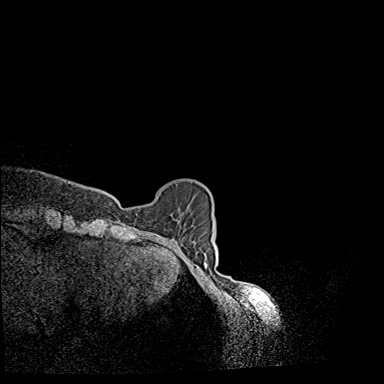
[im 64/160]
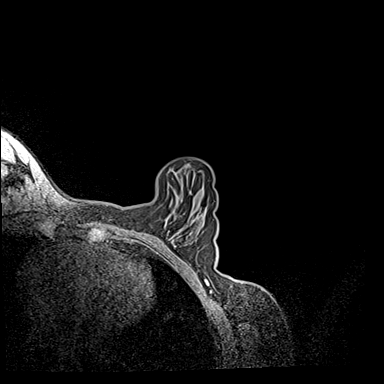
[im 96/160]
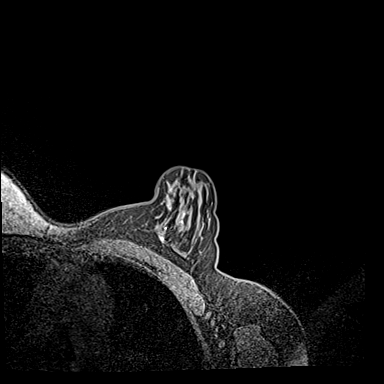
[im 128/160]
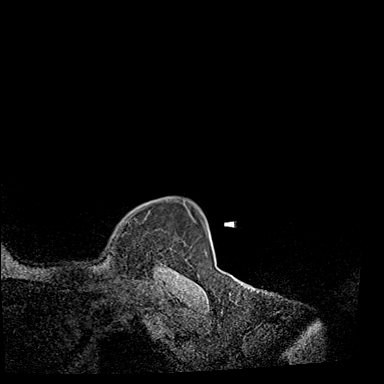
[im 160/160]
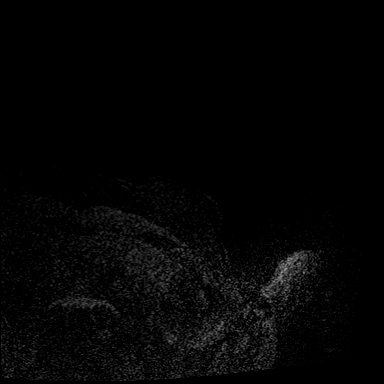

[Series 4: dynamic post 20 · axial · 1.3mm · 0.73mm/px · z∈[-128,+79]mm · 6 of 160 slices shown (1 of 2)]
[im 1/160]
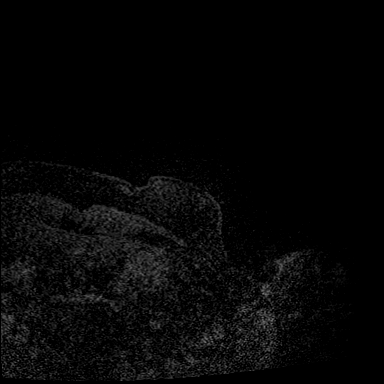
[im 32/160]
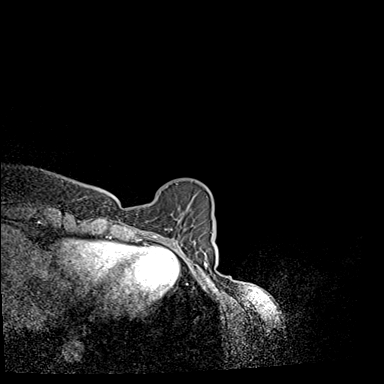
[im 64/160]
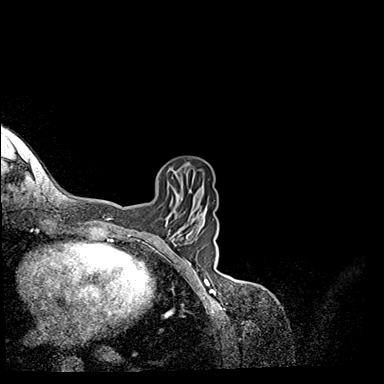
[im 96/160]
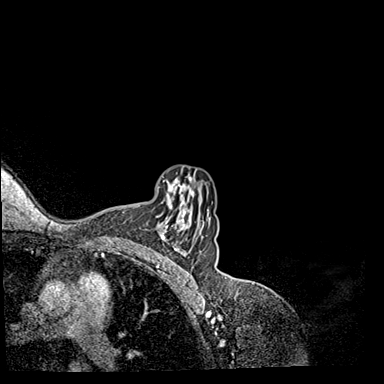
[im 128/160]
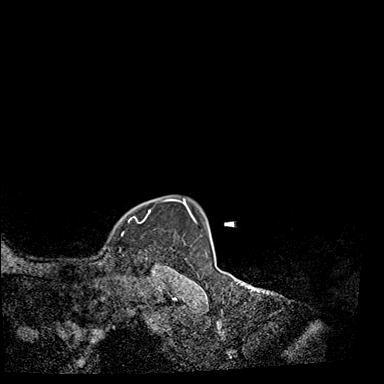
[im 160/160]
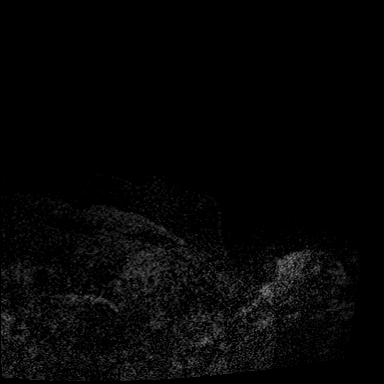

[Series 5: dynamic post 20 · axial · 1.3mm · 0.73mm/px · z∈[-128,+79]mm · 7 of 160 slices shown (2 of 2)]
[im 1/160]
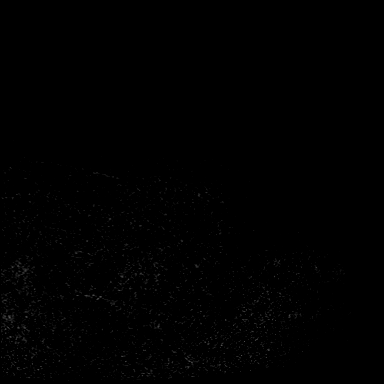
[im 27/160]
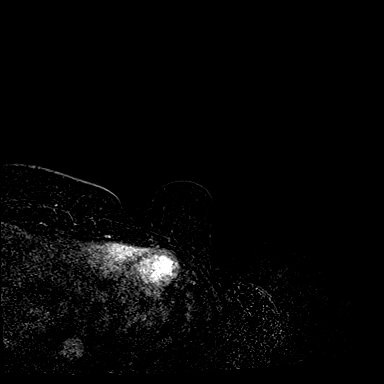
[im 54/160]
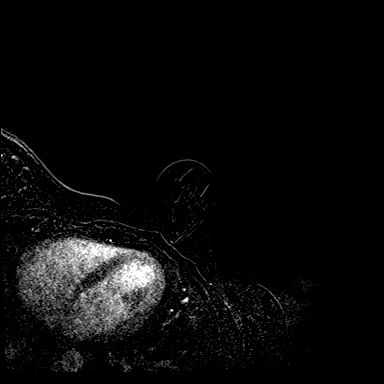
[im 80/160]
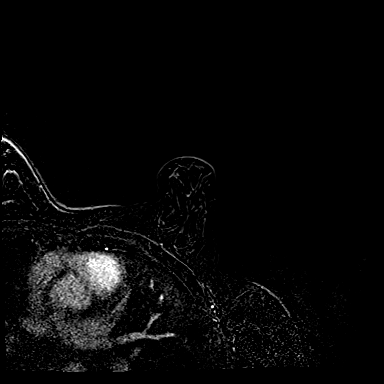
[im 107/160]
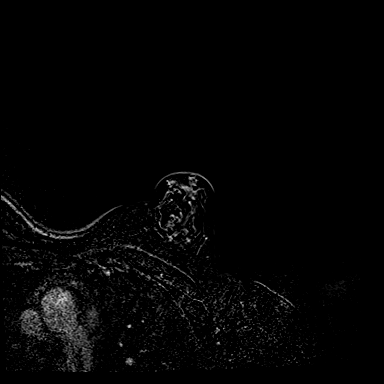
[im 133/160]
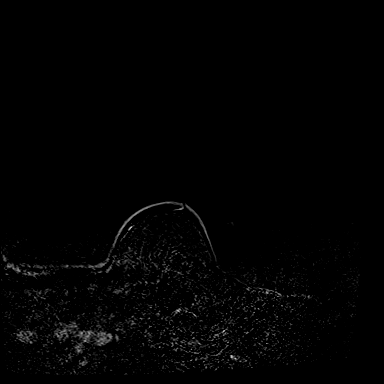
[im 160/160]
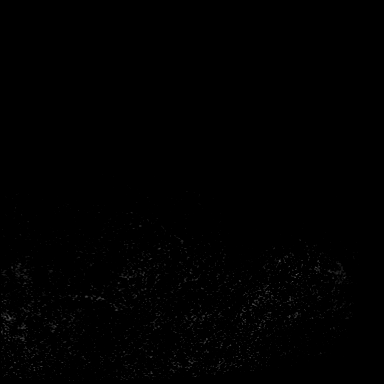

[Series 6: dynamic post 3 · axial · 1.3mm · 0.73mm/px · z∈[-128,+79]mm · 7 of 160 slices shown (1 of 2)]
[im 1/160]
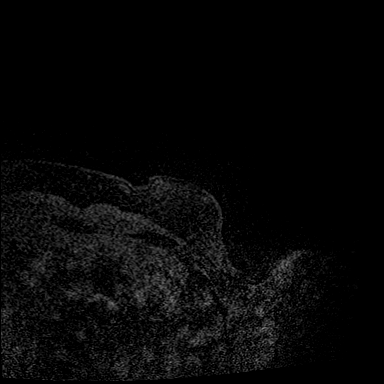
[im 27/160]
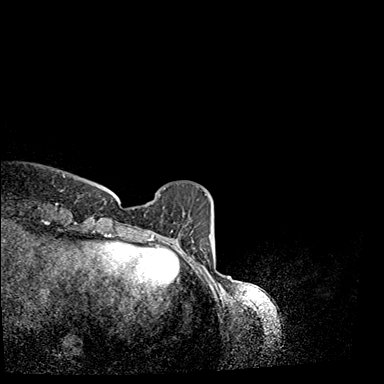
[im 54/160]
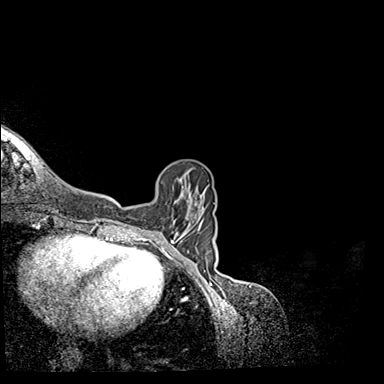
[im 80/160]
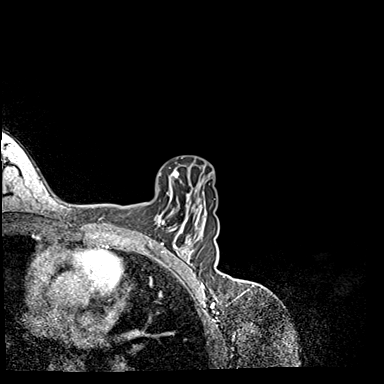
[im 107/160]
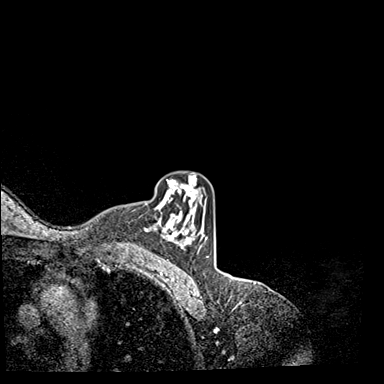
[im 133/160]
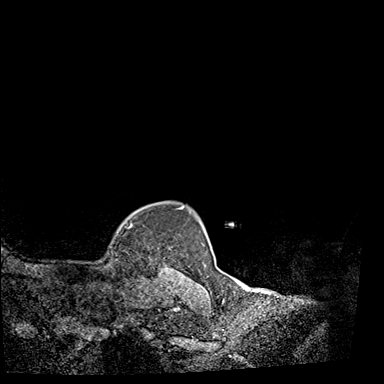
[im 160/160]
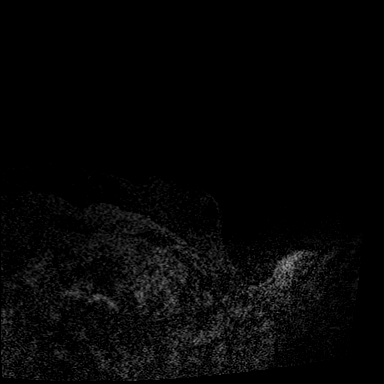

[Series 7: dynamic post 3 · axial · 1.3mm · 0.73mm/px · z∈[-128,+10]mm · 5 of 160 slices shown (2 of 2)]
[im 1/160]
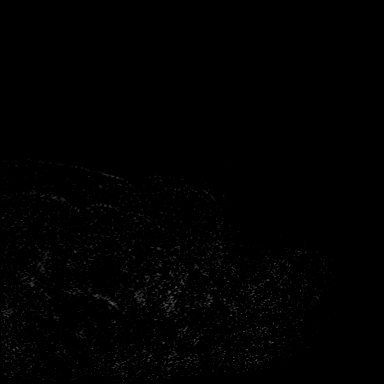
[im 27/160]
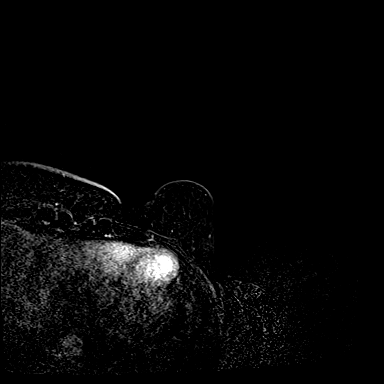
[im 54/160]
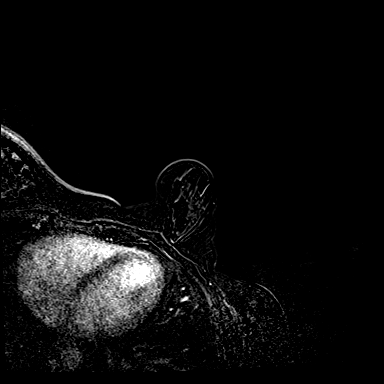
[im 80/160]
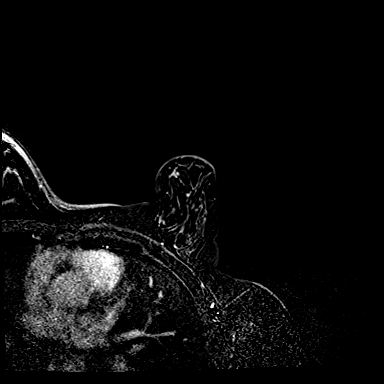
[im 107/160]
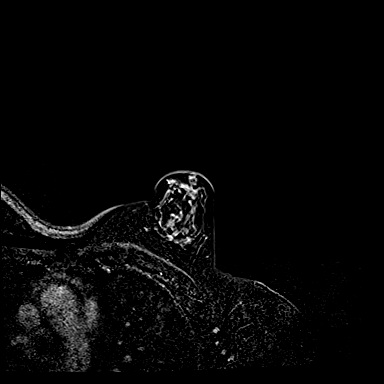

[32 of 48 positions shown; findings below may reference images not displayed]

FINDINGS: I met with the patient, and we discussed the procedure of MRI guided
biopsy, including risks, benefits, and alternatives. Specifically,
we discussed the risks of infection, bleeding, tissue injury, clip
migration, and inadequate sampling. Informed, written consent was
given. The usual time out protocol was performed immediately prior
to the procedure.

LESION #1: Posterior, upper inner quadrant, slightly medial to the
12 o'clock position.

Using sterile technique, 1% Lidocaine, MRI guidance, and a 9 gauge
vacuum assisted device, biopsy was performed of an area of non mass
enhancement in the posterior, upper and slightly inner aspect of the
left breast using a lateral approach. At the conclusion of the
procedure, a cylinder shaped tissue marker clip was deployed into
the biopsy cavity.

LESION #2: Anterior, upper inner quadrant.

Using sterile technique, 1% Lidocaine, MRI guidance, and a 9 gauge
vacuum assisted device, biopsy was performed of an area of non mass
enhancement in the more anterior, upper inner left breast using a
lateral approach. At the conclusion of the procedure, a dumbbell
shaped tissue marker clip was deployed into the biopsy cavity.

Follow-up 2-view mammogram was performed and dictated separately.
IMPRESSION: MRI guided biopsy of 2 areas of non mass enhancement in the left
breast in a patient with known left breast carcinoma. No apparent
complications.

## 2021-10-10 ENCOUNTER — Other Ambulatory Visit: Payer: Self-pay | Admitting: *Deleted

## 2021-10-10 DIAGNOSIS — M79671 Pain in right foot: Secondary | ICD-10-CM

## 2021-10-10 DIAGNOSIS — M79672 Pain in left foot: Secondary | ICD-10-CM

## 2023-05-29 ENCOUNTER — Other Ambulatory Visit: Payer: Self-pay | Admitting: Obstetrics

## 2023-06-14 ENCOUNTER — Encounter: Payer: Self-pay | Admitting: *Deleted

## 2023-06-14 DIAGNOSIS — O09519 Supervision of elderly primigravida, unspecified trimester: Secondary | ICD-10-CM | POA: Insufficient documentation

## 2023-06-14 DIAGNOSIS — D682 Hereditary deficiency of other clotting factors: Secondary | ICD-10-CM | POA: Insufficient documentation

## 2023-06-14 DIAGNOSIS — D6851 Activated protein C resistance: Secondary | ICD-10-CM | POA: Insufficient documentation

## 2023-06-18 ENCOUNTER — Other Ambulatory Visit (HOSPITAL_COMMUNITY)
Admission: RE | Admit: 2023-06-18 | Discharge: 2023-06-18 | Disposition: A | Discharge status: Home / Self Care | Source: Ambulatory Visit | Attending: Obstetrics and Gynecology | Admitting: Obstetrics and Gynecology

## 2023-06-18 ENCOUNTER — Ambulatory Visit: Payer: Self-pay | Admitting: Obstetrics and Gynecology

## 2023-06-18 ENCOUNTER — Encounter: Payer: Self-pay | Admitting: Obstetrics and Gynecology

## 2023-06-18 VITALS — BP 125/83 | HR 79

## 2023-06-18 DIAGNOSIS — O0990 Supervision of high risk pregnancy, unspecified, unspecified trimester: Secondary | ICD-10-CM | POA: Insufficient documentation

## 2023-06-18 DIAGNOSIS — D0512 Intraductal carcinoma in situ of left breast: Secondary | ICD-10-CM

## 2023-06-18 DIAGNOSIS — D682 Hereditary deficiency of other clotting factors: Secondary | ICD-10-CM

## 2023-06-18 DIAGNOSIS — O09511 Supervision of elderly primigravida, first trimester: Secondary | ICD-10-CM | POA: Diagnosis not present

## 2023-06-18 DIAGNOSIS — Z3A12 12 weeks gestation of pregnancy: Secondary | ICD-10-CM

## 2023-06-18 DIAGNOSIS — O0991 Supervision of high risk pregnancy, unspecified, first trimester: Secondary | ICD-10-CM | POA: Diagnosis not present

## 2023-06-19 ENCOUNTER — Encounter: Payer: Self-pay | Admitting: Maternal & Fetal Medicine

## 2023-06-19 ENCOUNTER — Encounter: Payer: Self-pay | Admitting: Obstetrics and Gynecology

## 2023-06-19 NOTE — Progress Notes (Addendum)
 New OB Note  06/19/2023   Clinic: [facility]  Chief Complaint: new OB  Transfer of Care Patient: yes [name])  History of Present Illness: [name] is a 38 y.o. G1P0000 at 12/3 weeks (EDC 11/22, based on Patient's last menstrual period was 03/23/2023.=8wk u/s)   Preg complicated by has History of PCOS; Ductal carcinoma in situ (DCIS) of left breast; AMA (advanced maternal age) primigravida 88+; Hereditary hemochromatosis (HCC); Factor 5 Leiden mutation, heterozygous (HCC); Supervision of high risk pregnancy, antepartum; on their problem list.  No SAB s/s. Very mild morning sickness s/s.  ROS: A 12-point review of systems was performed and negative, except as stated in the above HPI.  OBGYN History: As per HPI. OB History  Gravida Para Term Preterm AB Living  1 0 0 0 0 0  SAB IAB Ectopic Multiple Live Births  0 0 0 0 0    # Outcome Date GA Lbr Len/2nd Weight Sex Type Anes PTL Lv  1 Current             Obstetric Comments  1st Menstrual Cycle:  14      Any issues with any prior pregnancies: not applicable Prior children are healthy, doing well, and without any problems or issues: not applicable History of pap smears: Yes. Last pap smear 10/2019 and results were negative and HPV negative   Past Medical History: Past Medical History:  Diagnosis Date   Allergy    Asthma    exercised inducted   Bloody discharge from left nipple 09/17/2017   BRCA negative 09/25/2017   Invitae   Breast cancer (HCC) 2019   Left Breast   Breast lump on left side at 12 o'clock position 09/17/2017   Factor 5 Leiden mutation, heterozygous (HCC)    PCOS (polycystic ovarian syndrome)     Past Surgical History: Past Surgical History:  Procedure Laterality Date   BREAST BIOPSY Left 09/19/2017   DUCTAL CARCINOMA IN SITU WITH CALCIFICATIONS   COMPLETE MASTECTOMY W/ SENTINEL NODE BIOPSY  2019   INGUINAL HERNIA REPAIR Bilateral    child    Family  History:  Family History  Problem Relation Age of Onset   Autoimmune disease Mother    Autoimmune disease Father    Heart attack Father    Breast cancer Maternal Aunt        Great Aunt   Colon cancer Neg Hx     Social History:  Social History   Socioeconomic History   Marital status: Significant Other    Spouse name: Not on file   Number of children: Not on file   Years of education: Not on file   Highest education level: Not on file  Occupational History   Not on file  Tobacco Use   Smoking status: Never   Smokeless tobacco: Never  Substance and Sexual Activity   Alcohol use: Never   Drug use: Never   Sexual activity: Yes    Birth control/protection: None  Other Topics Concern   Not on file  Social History Narrative   Not on file   Social Drivers of Health   Financial Resource Strain: Not on file  Food Insecurity: Not on file  Transportation Needs: Not on file  Physical Activity: Inactive (11/11/2017)   Received from [facility]   Exercise Vital Sign    Days of Exercise per Week: 0 days    Minutes of Exercise per Session: 0 min  Stress: Stress Concern Present (  11/11/2017)   Received from [facility] - Occupational Stress Questionnaire    Feeling of Stress : Very much  Social Connections: Socially Isolated (11/11/2017)   Received from [facility]   Social Connection and Isolation Panel [NHANES]    Frequency of Communication with Friends and Family: Once a week    Frequency of Social Gatherings with Friends and Family: Once a week    Attends Religious Services: Never    Database administrator or Organizations: No    Attends Engineer, structural: Never    Marital Status: Never married  Catering manager Violence: Not on file   Allergy: Allergies  Allergen Reactions   Sulfa Antibiotics Hives and Swelling   Aspirin Hives   Ibuprofen Hives   Current  Outpatient Medications: Prenatal vitamin  Physical Exam:   BP 125/83   Pulse 79   LMP 03/23/2023  There is no height or weight on file to calculate BMI. Contractions: Not present Vag. Bleeding: None. FHTs: 150s   General appearance: Well nourished, well developed female in no acute distress.  Neck:  Supple, normal appearance, and no thyromegaly  Respiratory: Normal respiratory effort Abdomen: soft, nttp, nd Neuro/Psych:  Normal mood and affect.  Skin:  Warm and dry.   Laboratory: none  Imaging:  As per HPI  Assessment: patient stable  Plan: 1. Supervision of high risk pregnancy, antepartum (Primary) Patient wanting to limit u/s and wondering if needs MFM consult in a few days because they wanted an ultrasound at that time. I told her I'd check with them and see about any prelim recs and can defer consult until they see her at her anatomy u/s.   D/w her and recommended afp next visit since patient is wanting to limit ultrasounds during the pregnancy - VITAMIN D  25 Hydroxy (Vit-D Deficiency, Fractures) - Comprehensive metabolic panel with GFR - Protime-INR; Future - APTT - Anemia Profile B - Transferrin Saturation - Hemoglobin A1c - US  MFM OB DETAIL +14 WK; Future - Protein / creatinine ratio, urine - TSH Rfx on Abnormal to Free T4 - CBC/D/Plt+RPR+Rh+ABO+RubIgG... - Cervicovaginal ancillary only( [place]) - Lead, blood (adult age 57 yrs or greater)  2. Ductal carcinoma in situ (DCIS) of left breast Followed [facility] breast and seen at [redacted]wks GA and no issues or problems and recommend follow up 1 year and d/w pt re: consideration for continued hormone suppression therpay  Message sent to Dr. Emmette Harms re: need for screening maternal echo  pTisN0(i+) ductal carcinoma in situ (DCIS) of the left breast,  ER+, PR +, grade 2-3, status post bilateral skin sparing mastectomies, left deep axillary sentinel lymph node biopsy with immediate tissue expander placement. Pathology  revealed multifocal positive anterior margins and 2/2 involved lymph nodes with ITC.   3. Primigravida of advanced maternal age in first trimester Declines NIPT and horizon after discussion  4. Factor V Leiden heterozygote Mom diagnosed after VTE but patient with no personal history. I told her that I will d/w MFM but recommend no anti-coagulation now but 6 weeks postpartum prophylactic anti-coagulation  5. Hereditary hemochromatosis (HCC) See above for labs. Will d/w MFM for anything else, recs currently  Problem list reviewed and updated.  Follow up in 4 weeks.  The nature of [facility] with multiple MDs and other Advanced Practice Providers was explained to patient; also emphasized that residents, students are part of our team.  >  50% of 45 min visit spent on counseling and coordination of care.  Return in about 1 month (around 07/19/2023) for in person, md visit, high risk ob.  Future Appointments  Date Time Provider Department Center  06/21/2023  1:00 PM [facility] PROVIDER 1 [facility]  06/21/2023  1:30 PM [facility] US5 [facility]  06/21/2023  2:30 PM [facility] GENETIC COUNSELING RM [facility]  07/16/2023  8:55 AM Granville Layer, MD [facility]  08/22/2023 10:35 AM Anyanwu, Kathrine Paris, MD [facility]    Tyler Gallant MD Attending Center for [facility])

## 2023-06-20 ENCOUNTER — Encounter: Payer: Self-pay | Admitting: Obstetrics and Gynecology

## 2023-06-20 LAB — CERVICOVAGINAL ANCILLARY ONLY
Chlamydia: NEGATIVE
Comment: NEGATIVE
Comment: NORMAL
Neisseria Gonorrhea: NEGATIVE

## 2023-06-21 ENCOUNTER — Other Ambulatory Visit: Payer: Self-pay

## 2023-06-21 ENCOUNTER — Ambulatory Visit: Payer: Self-pay

## 2023-06-21 DIAGNOSIS — D682 Hereditary deficiency of other clotting factors: Secondary | ICD-10-CM

## 2023-06-21 DIAGNOSIS — O09519 Supervision of elderly primigravida, unspecified trimester: Secondary | ICD-10-CM

## 2023-06-23 LAB — ANEMIA PROFILE B
Ferritin: 154 ng/mL — ABNORMAL HIGH (ref 15–150)
Folate: >20 ng/mL (ref >3.0)
Iron Saturation: 18 % (ref 15–55)
Iron: 54 ug/dL (ref 27–159)
Retic Ct Pct: 1.6 % (ref 0.6–2.6)
Total Iron Binding Capacity: 304 ug/dL (ref 250–450)
UIBC: 250 ug/dL (ref 131–425)
Vitamin B-12: 613 pg/mL (ref 232–1245)

## 2023-06-23 LAB — PROTEIN / CREATININE RATIO, URINE
Creatinine, Urine: 83.8 mg/dL
Protein, Ur: 5.3 mg/dL
Protein/Creat Ratio: 63 mg/g{creat} (ref 0–200)

## 2023-06-23 LAB — COMPREHENSIVE METABOLIC PANEL WITH GFR
ALT: 18 IU/L (ref 0–32)
AST: 19 IU/L (ref 0–40)
Albumin: 4.2 g/dL (ref 3.9–4.9)
Alkaline Phosphatase: 40 IU/L — ABNORMAL LOW (ref 44–121)
BUN/Creatinine Ratio: 20 (ref 9–23)
BUN: 11 mg/dL (ref 6–20)
Bilirubin Total: 0.3 mg/dL (ref 0.0–1.2)
CO2: 20 mmol/L (ref 20–29)
Calcium: 9.3 mg/dL (ref 8.7–10.2)
Chloride: 101 mmol/L (ref 96–106)
Creatinine, Ser: 0.56 mg/dL — ABNORMAL LOW (ref 0.57–1.00)
Globulin, Total: 2.4 g/dL (ref 1.5–4.5)
Glucose: 91 mg/dL (ref 70–99)
Potassium: 4.2 mmol/L (ref 3.5–5.2)
Sodium: 135 mmol/L (ref 134–144)
Total Protein: 6.6 g/dL (ref 6.0–8.5)
eGFR: 121 mL/min/{1.73_m2} (ref >59)

## 2023-06-23 LAB — CBC/D/PLT+RPR+RH+ABO+RUBIGG...
Antibody Screen: NEGATIVE
Basophils Absolute: 0 10*3/uL (ref 0.0–0.2)
Basos: 0 %
EOS (ABSOLUTE): 0.3 10*3/uL (ref 0.0–0.4)
Eos: 3 %
HCV Ab: NONREACTIVE
HIV Screen 4th Generation wRfx: NONREACTIVE
Hematocrit: 40.4 % (ref 34.0–46.6)
Hemoglobin: 13.5 g/dL (ref 11.1–15.9)
Hepatitis B Surface Ag: NEGATIVE
Immature Grans (Abs): 0 10*3/uL (ref 0.0–0.1)
Immature Granulocytes: 0 %
Lymphocytes Absolute: 2.3 10*3/uL (ref 0.7–3.1)
Lymphs: 22 %
MCH: 31.3 pg (ref 26.6–33.0)
MCHC: 33.4 g/dL (ref 31.5–35.7)
MCV: 94 fL (ref 79–97)
Monocytes Absolute: 0.6 10*3/uL (ref 0.1–0.9)
Monocytes: 5 %
Neutrophils Absolute: 7.2 10*3/uL — ABNORMAL HIGH (ref 1.4–7.0)
Neutrophils: 70 %
Platelets: 251 10*3/uL (ref 150–450)
RBC: 4.32 x10E6/uL (ref 3.77–5.28)
RDW: 12.6 % (ref 11.7–15.4)
RPR Ser Ql: NONREACTIVE
Rh Factor: POSITIVE
Rubella Antibodies, IGG: 11.9 {index} (ref >0.99)
WBC: 10.4 10*3/uL (ref 3.4–10.8)

## 2023-06-23 LAB — TRANSFERRIN SATURATION
IRON SATN MFR SERPL: 16 %{saturation}
IRON SERPL-MCNC: 53 ug/dL
TRANSFERRIN SERPL-MCNC: 238 mg/dL

## 2023-06-23 LAB — VITAMIN D 25 HYDROXY (VIT D DEFICIENCY, FRACTURES): Vit D, 25-Hydroxy: 39.4 ng/mL (ref 30.0–100.0)

## 2023-06-23 LAB — HCV INTERPRETATION

## 2023-06-23 LAB — T4F: T4,Free (Direct): 1.06 ng/dL (ref 0.82–1.77)

## 2023-06-23 LAB — TSH RFX ON ABNORMAL TO FREE T4: TSH: 0.308 u[IU]/mL — ABNORMAL LOW (ref 0.450–4.500)

## 2023-06-23 LAB — HEMOGLOBIN A1C
Est. average glucose Bld gHb Est-mCnc: 111 mg/dL
Hgb A1c MFr Bld: 5.5 % (ref 4.8–5.6)

## 2023-06-23 LAB — APTT: aPTT: 26 s (ref 24–33)

## 2023-06-23 LAB — LEAD, BLOOD (ADULT >= 16 YRS): Lead-Whole Blood: <1 ug/dL (ref 0.0–3.4)

## 2023-06-25 ENCOUNTER — Ambulatory Visit: Payer: Self-pay | Admitting: Obstetrics and Gynecology

## 2023-06-25 DIAGNOSIS — R7989 Other specified abnormal findings of blood chemistry: Secondary | ICD-10-CM

## 2023-06-25 HISTORY — DX: Other specified abnormal findings of blood chemistry: R79.89

## 2023-07-08 ENCOUNTER — Encounter: Payer: Self-pay | Admitting: Obstetrics and Gynecology

## 2023-07-12 ENCOUNTER — Encounter: Payer: Self-pay | Admitting: Obstetrics and Gynecology

## 2023-07-12 DIAGNOSIS — O2342 Unspecified infection of urinary tract in pregnancy, second trimester: Secondary | ICD-10-CM | POA: Insufficient documentation

## 2023-07-16 ENCOUNTER — Encounter: Payer: Self-pay | Admitting: Family Medicine

## 2023-07-16 ENCOUNTER — Ambulatory Visit (INDEPENDENT_AMBULATORY_CARE_PROVIDER_SITE_OTHER): Admitting: Family Medicine

## 2023-07-16 VITALS — BP 114/77 | HR 71 | Wt 154.0 lb

## 2023-07-16 DIAGNOSIS — Z8742 Personal history of other diseases of the female genital tract: Secondary | ICD-10-CM

## 2023-07-16 DIAGNOSIS — D0512 Intraductal carcinoma in situ of left breast: Secondary | ICD-10-CM

## 2023-07-16 DIAGNOSIS — O0990 Supervision of high risk pregnancy, unspecified, unspecified trimester: Secondary | ICD-10-CM | POA: Diagnosis not present

## 2023-07-16 DIAGNOSIS — D6851 Activated protein C resistance: Secondary | ICD-10-CM

## 2023-07-16 DIAGNOSIS — O09512 Supervision of elderly primigravida, second trimester: Secondary | ICD-10-CM

## 2023-07-16 DIAGNOSIS — Z3A16 16 weeks gestation of pregnancy: Secondary | ICD-10-CM

## 2023-07-16 NOTE — Progress Notes (Signed)
   PRENATAL VISIT NOTE  Subjective:  Sandy Sullivan is a 38 y.o. G1P0000 at [redacted]w[redacted]d being seen today for ongoing prenatal care.  She is currently monitored for the following issues for this high-risk pregnancy and has History of PCOS; Ductal carcinoma in situ (DCIS) of left breast; AMA (advanced maternal age) primigravida 54+; Hereditary hemochromatosis (HCC); Factor 5 Leiden mutation, heterozygous (HCC); Supervision of high risk pregnancy, antepartum; Low TSH level; Elevated ferritin level; and UTI in pregnancy, antepartum, second trimester on their problem list.  Patient reports no complaints.  Contractions: Not present. Vag. Bleeding: None.  Movement: Absent. Denies leaking of fluid.   The following portions of the patient's history were reviewed and updated as appropriate: allergies, current medications, past family history, past medical history, past social history, past surgical history and problem list.   Objective:    Vitals:   07/16/23 0900  BP: 114/77  Pulse: 71  Weight: 154 lb (69.9 kg)    Fetal Status:  Fetal Heart Rate (bpm): 142-147   Movement: Absent    General: Alert, oriented and cooperative. Patient is in no acute distress.  Skin: Skin is warm and dry. No rash noted.   Cardiovascular: Normal heart rate noted  Respiratory: Normal respiratory effort, no problems with respiration noted  Abdomen: Soft, gravid, appropriate for gestational age.  Pain/Pressure: Absent     Pelvic: Cervical exam deferred        Extremities: Normal range of motion.  Edema: None  Mental Status: Normal mood and affect. Normal behavior. Normal judgment and thought content.   Assessment and Plan:  Pregnancy: G1P0000 at [redacted]w[redacted]d 1. Supervision of high risk pregnancy, antepartum (Primary) Continue routine prenatal care. Reports improved fatigue. Has anatomy u/s scheduled  2. Factor 5 Leiden mutation, heterozygous (HCC) No h/o RPL, no h/o DVT/PE or untoward pregnancy outcome. Ok for  anti-coagulation in the 6 weeks pp only. Discussed need for caution with traveling.  3. History of PCOS   4. Hereditary hemochromatosis (HCC) Hgb last at 13.5. Ferritin is 154 which is high but not > 1000. Has had phlebotomy in the past Will refer to Hematology Maternal ECHO per cardio/OB Normal LFT's - Ambulatory referral to Hematology / Oncology  5. Ductal carcinoma in situ (DCIS) of left breast In with [facility] She is s/p bilateral mastectomy with reconstruction, XRT and HR positive Letrozole/Goserelin form 08/2018-09/2020. No evidence of recurrence.  6. Primigravida of advanced maternal age in second trimester Declined NIPT  7. [redacted] weeks gestation of pregnancy Declined AFP  Preterm labor symptoms and general obstetric precautions including but not limited to vaginal bleeding, contractions, leaking of fluid and fetal movement were reviewed in detail with the patient. Please refer to After Visit Summary for other counseling recommendations.   Return in 4 weeks (on 08/13/2023).  Future Appointments  Date Time Provider Department Center  08/07/2023  8:00 AM [facility] PROVIDER 1 [facility]  08/07/2023  8:30 AM [facility] US1 [facility]  08/22/2023 10:35 AM Anyanwu, Kathrine Paris, MD [facility]    Granville Layer, MD

## 2023-07-16 NOTE — Progress Notes (Signed)
 ROB: Denies any concerns   Declined AFP

## 2023-07-25 ENCOUNTER — Encounter: Payer: Self-pay | Admitting: Obstetrics and Gynecology

## 2023-08-06 ENCOUNTER — Other Ambulatory Visit: Payer: Self-pay | Admitting: Family Medicine

## 2023-08-07 ENCOUNTER — Ambulatory Visit

## 2023-08-07 ENCOUNTER — Other Ambulatory Visit: Payer: Self-pay | Admitting: *Deleted

## 2023-08-07 ENCOUNTER — Ambulatory Visit: Attending: Obstetrics and Gynecology | Admitting: Maternal & Fetal Medicine

## 2023-08-07 VITALS — BP 110/64 | HR 79

## 2023-08-07 DIAGNOSIS — D6859 Other primary thrombophilia: Secondary | ICD-10-CM | POA: Diagnosis not present

## 2023-08-07 DIAGNOSIS — D6851 Activated protein C resistance: Secondary | ICD-10-CM | POA: Diagnosis not present

## 2023-08-07 DIAGNOSIS — O99112 Other diseases of the blood and blood-forming organs and certain disorders involving the immune mechanism complicating pregnancy, second trimester: Secondary | ICD-10-CM | POA: Diagnosis not present

## 2023-08-07 DIAGNOSIS — O0990 Supervision of high risk pregnancy, unspecified, unspecified trimester: Secondary | ICD-10-CM

## 2023-08-07 DIAGNOSIS — Z3689 Encounter for other specified antenatal screening: Secondary | ICD-10-CM

## 2023-08-07 DIAGNOSIS — Z923 Personal history of irradiation: Secondary | ICD-10-CM | POA: Diagnosis not present

## 2023-08-07 DIAGNOSIS — Z3A19 19 weeks gestation of pregnancy: Secondary | ICD-10-CM | POA: Diagnosis not present

## 2023-08-07 DIAGNOSIS — Z363 Encounter for antenatal screening for malformations: Secondary | ICD-10-CM | POA: Diagnosis present

## 2023-08-07 DIAGNOSIS — Z9013 Acquired absence of bilateral breasts and nipples: Secondary | ICD-10-CM | POA: Insufficient documentation

## 2023-08-07 DIAGNOSIS — O09512 Supervision of elderly primigravida, second trimester: Secondary | ICD-10-CM | POA: Insufficient documentation

## 2023-08-07 DIAGNOSIS — Z853 Personal history of malignant neoplasm of breast: Secondary | ICD-10-CM | POA: Insufficient documentation

## 2023-08-07 DIAGNOSIS — O321XX0 Maternal care for breech presentation, not applicable or unspecified: Secondary | ICD-10-CM | POA: Insufficient documentation

## 2023-08-07 NOTE — Progress Notes (Signed)
 Patient information  Patient Name: Sandy Sullivan  Patient MRN:   [id number]  Referring practice: MFM Referring Provider: [facility]  Problem List   Patient Active Problem List   Diagnosis Date Noted   UTI in pregnancy, antepartum, second trimester 07/12/2023   Low TSH level 06/25/2023   Elevated ferritin level 06/25/2023   Supervision of high risk pregnancy, antepartum 06/18/2023   AMA (advanced maternal age) primigravida 35+ 06/14/2023   Hereditary hemochromatosis (HCC) 06/14/2023   Factor 5 Leiden mutation, heterozygous (HCC) 06/14/2023   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 G1P0000 at [redacted]w[redacted]d here for ultrasound and consultation. She declined all genetic testing.   Today we focused on the following:   Factor V Leiden: The patient reports that her mother is homozygous for factor V Leiden so the patient was tested and was noted to be heterozygous.  I discussed that this is not a strong risk factor for thromboembolic event when isolated in the absence of a personal history of thrombosis.  The patient denies any personal history of thrombosis.  I discussed that this risk factor is not enough to warrant anticoagulation during pregnancy or postpartum unless there is an additional risk factor such as a cesarean delivery  History of ductal carcinoma in situ: This was treated back in 2019 she is now status postsurgery in the form of bilateral mastectomy and radiation therapy with GnRH therapy until 2022.  The tumor was ER+, PR +, grade 2-3.she had bilateral skin sparing mastectomies, left deep axillary sentinel lymph node biopsy with immediate tissue expander placement. Pathology revealed multifocal positive anterior margins and 2/2 involved lymph nodes with ITC.  She is doing well today without any signs or symptoms of recurrence.  She knows to notify her  oncologist with any concerns.  This should not impact her pregnancy in any negative way since she is far enough out from her initial treatment.  Advanced maternal age: Based on her age alone her risk of having a fetus with Down syndrome is approximately 1/200.  Her risk of any aneuploidy during pregnancy is slightly higher than this.  She understands that ultrasound alone cannot diagnose genetic problems or abnormalities.  She is comfortable with these results and does not desire further screening or testing at this time but will consider her options and let her OB provider know if she changes her mind.  Hemochromatosis: This is a genetic condition inherited in an autosomal recessive manner. This condition is associated with hypertensive disorders of pregnancy as well as thromboembolic events but is a weak risk factor overall.  As long as the patient is able to remain active I do not think anticoagulation is necessary during the pregnancy but I did give the patient the option to choose anticoagulation at any time if she thinks she is less active or would like additional thromboembolic protection during the pregnancy.  I specifically discussed the risks of Lovenox include pain at the injection site, hematoma formation and inconvenience of administration.  The risks would include increased risk of bleeding as well as complications of neuraxial analgesia if required.  The patient will let her OB provider know if she desires Lovenox at any time based on her current risk factors.   Sonographic findings Single intrauterine pregnancy at 19w 4d. Fetal cardiac activity:  Observed and appears normal. Presentation: Breech. The anatomic structures  that were well seen appear normal without evidence of soft markers. The anatomic survey is complete except for the placental cord insertion.  Fetal biometry shows the estimated fetal weight at the 73 percentile. Amniotic fluid: Within normal limits.  MVP: 5.48  cm. Placenta: Right lateral. Adnexa: No abnormality visualized. Cervical length: 3.6 cm.  There are limitations of prenatal ultrasound such as the inability to detect certain abnormalities due to poor visualization. Various factors such as fetal position, gestational age and maternal body habitus may increase the difficulty in visualizing the fetal anatomy.    Recommendations -EDD should be 12/28/2023 based on  LMP  (03/23/23). - Aneuploidy screening was offered again today but declined.  If the patient desires genetic testing please refer back to genetic counseling for aneuploidy screening and carrier screening for hemochromatosis. - Anatomy ultrasound was done today with the above findings (see report). - Based on the current risk factors of factor V Leiden heterozygous, hemochromatosis in pregnancy it is unclear if pharmacologic anticoagulation is needed during the pregnancy.  If the patient were to become significantly less active then anticoagulation should be considered during the pregnancy and postpartum period.  Anticoagulation is recommended in the setting of a cesarean delivery for at least 6 weeks following delivery.  -Follow-up growth ultrasound in 4 to 5 weeks to assess the placental cord insertion site - Delivery timing pending clinical course  Review of Systems: A review of systems was performed and was negative except per HPI   Past Obstetrical History:  OB History  Gravida Para Term Preterm AB Living  1 0 0 0 0 0  SAB IAB Ectopic Multiple Live Births  0 0 0 0 0    # Outcome Date GA Lbr Len/2nd Weight Sex Type Anes PTL Lv  1 Current             Obstetric Comments  1st Menstrual Cycle:  14        Past Medical History:  Past Medical History:  Diagnosis Date   Allergy    Asthma    exercised inducted   Bloody discharge from left nipple 09/17/2017   BRCA negative 09/25/2017   [facility]   Breast cancer (HCC) 2019   Left Breast. s/p b/l mastectomies, XRT. followed by  [facility]   Breast lump on left side at 12 o'clock position 09/17/2017   Factor 5 Leiden mutation, heterozygous (HCC)    PCOS (polycystic ovarian syndrome)      Past Surgical History:    Past Surgical History:  Procedure Laterality Date   BREAST BIOPSY Left 09/19/2017   DUCTAL CARCINOMA IN SITU WITH CALCIFICATIONS   COMPLETE MASTECTOMY W/ SENTINEL NODE BIOPSY  2019   INGUINAL HERNIA REPAIR Bilateral    child     Home Medications:   Current Outpatient Medications on File Prior to Visit  Medication Sig Dispense Refill   Omega-3 Fatty Acids (OMEGA-3 FISH OIL PO) Take by mouth.     Prenatal Vit-Fe Fumarate-FA (PRENATAL MULTIVITAMIN) TABS tablet Take 1 tablet by mouth daily at 12 noon.     No current facility-administered medications on file prior to visit.      Allergies:   Allergies  Allergen Reactions   Sulfa Antibiotics Hives and Swelling   Aspirin Hives   Ibuprofen Hives     Physical Exam:   Vitals:   08/07/23 0800  BP: 110/64  Pulse: 79   Sitting comfortably on the sonogram table Nonlabored breathing Normal rate and rhythm Abdomen is nontender  Thank  you for the opportunity to be involved with this patient's care. Please let us  know if we can be of any further assistance.   60  minutes of time was spent reviewing the patient's chart including labs, imaging and documentation.  At least 50% of this time was spent with direct patient care discussing the diagnosis, management and prognosis of her care.  Alan Olszewski MFM, [facility]   08/07/2023  9:34 AM

## 2023-08-12 ENCOUNTER — Encounter: Payer: Self-pay | Admitting: Obstetrics and Gynecology

## 2023-08-12 DIAGNOSIS — N856 Intrauterine synechiae: Secondary | ICD-10-CM | POA: Insufficient documentation

## 2023-08-22 ENCOUNTER — Encounter: Admitting: Obstetrics & Gynecology

## 2023-08-26 ENCOUNTER — Ambulatory Visit (INDEPENDENT_AMBULATORY_CARE_PROVIDER_SITE_OTHER): Admitting: Family Medicine

## 2023-08-26 VITALS — BP 117/75 | HR 89 | Wt 162.0 lb

## 2023-08-26 DIAGNOSIS — O0990 Supervision of high risk pregnancy, unspecified, unspecified trimester: Secondary | ICD-10-CM

## 2023-08-26 DIAGNOSIS — D6851 Activated protein C resistance: Secondary | ICD-10-CM

## 2023-08-26 DIAGNOSIS — D0512 Intraductal carcinoma in situ of left breast: Secondary | ICD-10-CM

## 2023-08-26 DIAGNOSIS — O2342 Unspecified infection of urinary tract in pregnancy, second trimester: Secondary | ICD-10-CM

## 2023-08-26 DIAGNOSIS — R7989 Other specified abnormal findings of blood chemistry: Secondary | ICD-10-CM | POA: Diagnosis not present

## 2023-08-26 NOTE — Progress Notes (Unsigned)
 PRENATAL VISIT NOTE  Subjective:  Sandy Sullivan is a 38 y.o. G1P0000 at [redacted]w[redacted]d being seen today for ongoing prenatal care.  She is currently monitored for the following issues for this high-risk pregnancy and has Ductal carcinoma in situ (DCIS) of left breast; AMA (advanced maternal age) primigravida 57+; Hereditary hemochromatosis (HCC); Factor 5 Leiden mutation, heterozygous (HCC); Supervision of high risk pregnancy, antepartum; Low TSH level; Elevated ferritin level; UTI in pregnancy, antepartum, second trimester; and Intrauterine synechiae on their problem list.  Patient reports no complaints.  Contractions: Not present. Vag. Bleeding: None.  Movement: Present. Denies leaking of fluid.   The following portions of the patient's history were reviewed and updated as appropriate: allergies, current medications, past family history, past medical history, past social history, past surgical history and problem list.   Objective:    Vitals:   08/26/23 1539  BP: 117/75  Pulse: 89  Weight: 162 lb (73.5 kg)    Fetal Status:  Fetal Heart Rate (bpm): 143   Movement: Present    General: Alert, oriented and cooperative. Patient is in no acute distress.  Skin: Skin is warm and dry. No rash noted.   Cardiovascular: Normal heart rate noted  Respiratory: Normal respiratory effort, no problems with respiration noted  Abdomen: Soft, gravid, appropriate for gestational age.  Pain/Pressure: Absent     Pelvic: Cervical exam deferred        Extremities: Normal range of motion.     Mental Status: Normal mood and affect. Normal behavior. Normal judgment and thought content.   Assessment and Plan:  Pregnancy: G1P0000 at [redacted]w[redacted]d 1. Supervision of high risk pregnancy, antepartum (Primary) Up to date FH appropriate Feeling movement Urine culture today Ordered labs for next visit Anatomy scan completed, follow up in August  - Culture, OB Urine - TSH Rfx on Abnormal to Free T4; Future - Anemia  Profile B; Future - Glucose Tolerance, 2 Hours w/1 Hour; Future - RPR; Future - HIV Antibody (routine testing w rflx); Future  2. UTI in pregnancy, antepartum, second trimester - Culture, OB Urine  3. Low TSH level - TSH Rfx on Abnormal to Free T4; Future  4. Hereditary hemochromatosis (HCC) - Anemia Profile B; Future - last hgb WNL - seeing heme this month  5. Factor 5 Leiden mutation, heterozygous (HCC) No h/o RPL, no h/o DVT/PE or untoward pregnancy outcome. Ok for anti-coagulation in the 6 weeks pp only. Discussed need for caution with traveling.  - Anemia Profile B; Future  6. Ductal carcinoma in situ (DCIS) of left breast In with [facility] oncology She is s/p bilateral mastectomy with reconstruction, XRT and HR positive Letrozole/Goserelin form 08/2018-09/2020. No evidence of recurrence.  {Blank single:19197::Term,Preterm} labor symptoms and general obstetric precautions including but not limited to vaginal bleeding, contractions, leaking of fluid and fetal movement were reviewed in detail with the patient. Please refer to After Visit Summary for other counseling recommendations.   No follow-ups on file.  Future Appointments  Date Time Provider Department Center  09/02/2023  3:30 PM Babara Call, MD [facility] None  09/02/2023  4:15 PM [facility] None  09/12/2023  3:00 PM [facility] PROVIDER 1 [facility]  09/12/2023  3:45 PM [facility] US2 [facility]  09/23/2023  3:50 PM Eldonna Suzen Octave, MD [facility]  10/08/2023  9:00 AM [facility]  10/08/2023  9:35 AM Izell Harari, MD [facility]  10/22/2023  3:50 PM Izell Harari, MD [facility]  11/05/2023  3:50 PM Anyanwu, Ugonna  A, MD [facility]    Suzen Maryan Masters, MD

## 2023-08-28 ENCOUNTER — Encounter: Payer: Self-pay | Admitting: Family Medicine

## 2023-08-28 LAB — URINE CULTURE, OB REFLEX

## 2023-08-28 LAB — CULTURE, OB URINE

## 2023-09-02 ENCOUNTER — Inpatient Hospital Stay

## 2023-09-02 ENCOUNTER — Ambulatory Visit: Payer: Self-pay | Admitting: Oncology

## 2023-09-02 ENCOUNTER — Encounter: Payer: Self-pay | Admitting: Oncology

## 2023-09-02 ENCOUNTER — Inpatient Hospital Stay: Attending: Oncology | Admitting: Oncology

## 2023-09-02 VITALS — BP 119/79 | HR 73 | Temp 98.7°F | Resp 18 | Wt 157.9 lb

## 2023-09-02 DIAGNOSIS — Z148 Genetic carrier of other disease: Secondary | ICD-10-CM

## 2023-09-02 DIAGNOSIS — Z79899 Other long term (current) drug therapy: Secondary | ICD-10-CM | POA: Diagnosis not present

## 2023-09-02 DIAGNOSIS — Z9013 Acquired absence of bilateral breasts and nipples: Secondary | ICD-10-CM | POA: Diagnosis not present

## 2023-09-02 DIAGNOSIS — Z853 Personal history of malignant neoplasm of breast: Secondary | ICD-10-CM | POA: Diagnosis not present

## 2023-09-02 DIAGNOSIS — E282 Polycystic ovarian syndrome: Secondary | ICD-10-CM | POA: Diagnosis not present

## 2023-09-02 DIAGNOSIS — D6851 Activated protein C resistance: Secondary | ICD-10-CM | POA: Diagnosis not present

## 2023-09-02 DIAGNOSIS — Z86000 Personal history of in-situ neoplasm of breast: Secondary | ICD-10-CM

## 2023-09-02 LAB — CBC (CANCER CENTER ONLY)
HCT: 34.9 % — ABNORMAL LOW (ref 36.0–46.0)
Hemoglobin: 12.5 g/dL (ref 12.0–15.0)
MCH: 32.4 pg (ref 26.0–34.0)
MCHC: 35.8 g/dL (ref 30.0–36.0)
MCV: 90.4 fL (ref 80.0–100.0)
Platelet Count: 237 K/uL (ref 150–400)
RBC: 3.86 MIL/uL — ABNORMAL LOW (ref 3.87–5.11)
RDW: 12.3 % (ref 11.5–15.5)
WBC Count: 10.1 K/uL (ref 4.0–10.5)
nRBC: 0 % (ref 0.0–0.2)

## 2023-09-02 LAB — HEPATIC FUNCTION PANEL
ALT: 15 U/L (ref 0–44)
AST: 16 U/L (ref 15–41)
Albumin: 3.7 g/dL (ref 3.5–5.0)
Alkaline Phosphatase: 39 U/L (ref 38–126)
Bilirubin, Direct: <0.1 mg/dL (ref 0.0–0.2)
Total Bilirubin: 0.5 mg/dL (ref 0.0–1.2)
Total Protein: 6.9 g/dL (ref 6.5–8.1)

## 2023-09-02 LAB — IRON AND TIBC
Iron: 61 ug/dL (ref 28–170)
Saturation Ratios: 14 % (ref 10.4–31.8)
TIBC: 447 ug/dL (ref 250–450)
UIBC: 386 ug/dL

## 2023-09-02 LAB — FERRITIN: Ferritin: 30 ng/mL (ref 11–307)

## 2023-09-02 NOTE — Progress Notes (Addendum)
 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 Continue follow-up with [facility].  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 Lab Results  Component Value Date   HGB 12.5 09/02/2023   TIBC 447 09/02/2023   IRONPCTSAT 14 09/02/2023   FERRITIN 30 09/02/2023    She has a history of elevated ferritin, normal iron saturation. Today's iron panel showed normal ferritin level and iron saturation. Normal energy.  No signs of iron overload. No need for any treatments.    Factor 5 Leiden mutation, heterozygous (HCC) Heterozygous factor V Leiden mutation, no personal history of thrombosis.  family history of DVT. Recommend Lovenox prophylaxis for up to 6 weeks if patient gets C-section.  Orders Placed This Encounter  Procedures   Iron and TIBC    Standing Status:   Future    Number of Occurrences:   1    Expected Date:   09/02/2023    Expiration Date:   12/01/2023   Ferritin    Standing Status:   Future    Number of Occurrences:   1    Expected Date:   09/02/2023    Expiration Date:   12/01/2023   CBC (Cancer Center Only)    Standing Status:   Future    Number of Occurrences:   1    Expected Date:   09/02/2023    Expiration Date:   12/01/2023   Hepatic function panel    Standing Status:   Future    Number of Occurrences:   1    Expected Date:   09/02/2023    Expiration Date:   12/01/2023   Recommend follow-up 6 months All questions were answered. The patient knows to call the clinic with any problems, questions or concerns.  Zelphia Cap, MD,  PhD [facility] Hematology Oncology 09/02/2023   HISTORY OF PRESENTING ILLNESS:   Sandy Sullivan is a  38 y.o.  female with PMH listed below was seen in consultation at the request of  Fredirick Glenys RAMAN, MD  for evaluation of elevated ferritin, hemochromatosis.  Discussed the use of AI scribe software for clinical note transcription with the patient, who gave verbal consent to proceed.   May 23, 2021, patient was found to have elevated ferritin level.  Additional workup she has heterozygous S65C mutation.   Ferritin levels have been intermittently elevated, with the most recent test in May 2025 showing slightly elevated ferritin and an iron saturation of 18%.  She is currently pregnant, in her second trimester, and due in November 2020. She has a dental issue with a root canal abscess that flares up occasionally.  She has a history of DCIS, treated at [facility] with bilateral mastectomy followed by hormone suppression therapy for two years starting in 2019. She continues to follow up with her oncologist.  She has a Factor V Leiden mutation, heterozygous, with no history of blood clots. Her mother is homozygous for the mutation and has history of thrombosis.   Mother also has lupus.  She is currently taking a prenatal vitamin that does not contain iron . She previously consumed beef liver as a supplement but  has since stopped. She has a history of PCOS and required progesterone to regulate her menstrual cycles after hormone therapy, achieving regular cycles for the first time.      MEDICAL HISTORY:  Past Medical History:  Diagnosis Date   Allergy    Asthma    exercised inducted   Bloody discharge from left nipple 09/17/2017   BRCA negative 09/25/2017   [facility]   Breast cancer (HCC) 2019   Left Breast. s/p b/l mastectomies, XRT. followed by [facility]   Breast lump on left side at 12 o'clock position 09/17/2017   Factor 5 Leiden mutation, heterozygous (HCC)    History of PCOS 09/17/2017    PCOS (polycystic ovarian syndrome)     SURGICAL HISTORY: Past Surgical History:  Procedure Laterality Date   BREAST BIOPSY Left 09/19/2017   DUCTAL CARCINOMA IN SITU WITH CALCIFICATIONS   COMPLETE MASTECTOMY W/ SENTINEL NODE BIOPSY  2019   INGUINAL HERNIA REPAIR Bilateral    child    SOCIAL HISTORY: Social History   Socioeconomic History   Marital status: Significant Other    Spouse name: Not on file   Number of children: Not on file   Years of education: Not on file   Highest education level: Not on file  Occupational History   Not on file  Tobacco Use   Smoking status: Never   Smokeless tobacco: Never  Vaping Use   Vaping status: Never Used  Substance and Sexual Activity   Alcohol use: Never   Drug use: Never   Sexual activity: Yes    Birth control/protection: None  Other Topics Concern   Not on file  Social History Narrative   Not on file   Social Drivers of Health   Financial Resource Strain: Low Risk  (09/02/2023)   Overall Financial Resource Strain (CARDIA)    Difficulty of Paying Living Expenses: Not very hard  Food Insecurity: No Food Insecurity (09/02/2023)   Hunger Vital Sign    Worried About Running Out of Food in the Last Year: Never true    Ran Out of Food in the Last Year: Never true  Transportation Needs: No Transportation Needs (09/02/2023)   PRAPARE - Administrator, Civil Service (Medical): No    Lack of Transportation (Non-Medical): No  Physical Activity: Inactive (11/11/2017)   [facility]   Exercise Vital Sign    Days of Exercise per Week: 0 days    Minutes of Exercise per Session: 0 min  Stress: Stress Concern Present (11/11/2017)   Received from [facility] - Occupational Stress Questionnaire    Feeling of Stress : Very much  Social Connections: Socially Isolated (11/11/2017)   Received from [facility]   Social  Connection and Isolation Panel    Frequency of Communication with Friends and Family: Once a week    Frequency of Social Gatherings with Friends and Family: Once a week    Attends Religious Services: Never    Database administrator or Organizations: No    Attends Banker Meetings: Never    Marital Status: Never married  Intimate Partner Violence: Not At Risk (09/02/2023)   Humiliation, Afraid, Rape, and Kick questionnaire    Fear of Current or Ex-Partner: No    Emotionally Abused: No    Physically Abused: No    Sexually Abused: No    FAMILY HISTORY: Family History  Problem Relation Age of  Onset   Autoimmune disease Mother    Factor V Leiden deficiency Mother    Autoimmune disease Father    Heart attack Father    Breast cancer Maternal Aunt        Great Aunt   Colon cancer Neg Hx     ALLERGIES:  is allergic to sulfa antibiotics, aspirin, and ibuprofen.  MEDICATIONS:  Current Outpatient Medications  Medication Sig Dispense Refill   Omega-3 Fatty Acids (OMEGA-3 FISH OIL PO) Take by mouth.     Prenatal Vit-Fe Fumarate-FA (PRENATAL MULTIVITAMIN) TABS tablet Take 1 tablet by mouth daily at 12 noon.     No current facility-administered medications for this visit.    Review of Systems  Constitutional:  Negative for appetite change, chills, fatigue and fever.  HENT:   Negative for hearing loss and voice change.   Eyes:  Negative for eye problems.  Respiratory:  Negative for chest tightness and cough.   Cardiovascular:  Negative for chest pain.  Gastrointestinal:  Negative for abdominal distention, abdominal pain and blood in stool.  Endocrine: Negative for hot flashes.  Genitourinary:  Negative for difficulty urinating and frequency.   Musculoskeletal:  Negative for arthralgias.  Skin:  Negative for itching and rash.  Neurological:  Negative for extremity weakness.  Hematological:  Negative for adenopathy.  Psychiatric/Behavioral:  Negative for confusion.     PHYSICAL EXAMINATION:  Vitals:   09/02/23 1525  BP: 119/79  Pulse: 73  Resp: 18  Temp: 98.7 F (37.1 C)  SpO2: 99%   Filed Weights   09/02/23 1525  Weight: 157 lb 14.4 oz (71.6 kg)    Physical Exam Constitutional:      General: She is not in acute distress. HENT:     Head: Normocephalic and atraumatic.  Eyes:     General: No scleral icterus. Cardiovascular:     Rate and Rhythm: Normal rate and regular rhythm.     Heart sounds: Normal heart sounds.  Pulmonary:     Effort: Pulmonary effort is normal. No respiratory distress.     Breath sounds: No wheezing.  Abdominal:     Comments: Gravid uterus  Musculoskeletal:        General: No deformity. Normal range of motion.     Cervical back: Normal range of motion and neck supple.  Skin:    General: Skin is warm and dry.     Findings: No erythema or rash.  Neurological:     Mental Status: She is alert and oriented to person, place, and time. Mental status is at baseline.  Psychiatric:        Mood and Affect: Mood normal.     LABORATORY DATA:  I have reviewed the data as listed    Latest Ref Rng & Units 09/02/2023    4:10 PM 06/18/2023    3:38 PM 09/18/2017    8:30 AM  CBC  WBC 4.0 - 10.5 K/uL 10.1  10.4  6.2   Hemoglobin 12.0 - 15.0 g/dL 87.4  86.4  85.6   Hematocrit 36.0 - 46.0 % 34.9  40.4  42.4   Platelets 150 - 400 K/uL 237  251  240       Latest Ref Rng & Units 09/02/2023    4:10 PM 06/18/2023    3:38 PM 09/18/2017    8:30 AM  CMP  Glucose 70 - 99 mg/dL  91  89   BUN 6 - 20 mg/dL  11  13   Creatinine 9.42 - 1.00  mg/dL  9.43  9.32   Sodium 865 - 144 mmol/L  135  140   Potassium 3.5 - 5.2 mmol/L  4.2  4.1   Chloride 96 - 106 mmol/L  101  104   CO2 20 - 29 mmol/L  20  24   Calcium 8.7 - 10.2 mg/dL  9.3  9.1   Total Protein 6.5 - 8.1 g/dL 6.9  6.6  7.0   Total Bilirubin 0.0 - 1.2 mg/dL 0.5  0.3  0.7   Alkaline Phos 38 - 126 U/L 39  40  50   AST 15 - 41 U/L 16  19  13    ALT 0 - 44 U/L 15  18  12         RADIOGRAPHIC STUDIES: I have personally reviewed the radiological images as listed and agreed with the findings in the report. US  MFM OB DETAIL +14 WK Result Date: 08/07/2023 ----------------------------------------------------------------------  OBSTETRICS REPORT                       (Signed Final 08/07/2023 04:22 pm) ---------------------------------------------------------------------- Patient Info  ID #:       [id number]                          D.O.B.:  May 24, 1985 (38 yrs)(F)  Name:        [facility]                Visit Date: 08/07/2023 08:11 am ---------------------------------------------------------------------- Performed By  Attending:        Delora Smaller DO       Ref. Address:     [address]                                                             [address]  Performed By:     Nat GORMAN Plant     [facility]                    BS, RDMS                                 [facility]  Referred By:      [facility] ---------------------------------------------------------------------- Orders  #  Description  Code        Ordered By  1  US  MFM OB DETAIL +14 WK               [id number]    BEBE FURRY ----------------------------------------------------------------------  #  Order #                     Accession #                Episode #  1  [id number]                   [id number]                 [id number] ---------------------------------------------------------------------- Indications  [redacted] weeks gestation of pregnancy                Z3A.19  Encounter for antenatal screening for          Z36.3  malformations  Advanced maternal age primigravida 32+,        O57.512  second trimester  Factor V Leiden mutation affecting             O99.119  pregnancy  Medical complication of pregnancy               O26.90  (Hereditary Hemochromatosis) ---------------------------------------------------------------------- Vital Signs                            Pulse:  79  BP:          110/64 ---------------------------------------------------------------------- Fetal Evaluation  Num Of Fetuses:         1  Fetal Heart Rate(bpm):  164  Cardiac Activity:       Observed  Presentation:           Breech  Placenta:               Right lateral  P. Cord Insertion:      Not well visualized  Amniotic Fluid  AFI FV:      Within normal limits                              Largest Pocket(cm)                              5.48 ---------------------------------------------------------------------- Biometry  BPD:      45.9  mm     G. Age:  19w 6d         63  %    CI:        74.73   %    70 - 86                                                          FL/HC:      17.3   %    16.8 - 19.8  HC:      168.5  mm     G. Age:  19w 3d         39  %    HC/AC:      1.05        1.09 - 1.39  AC:  159.8  mm     G. Age:  21w 1d         88  %    FL/BPD:     63.4   %  FL:       29.1  mm     G. Age:  19w 0d         22  %    FL/AC:      18.2   %    20 - 24  HUM:      28.3  mm     G. Age:  19w 1d         41  %  CER:        20  mm     G. Age:  19w 2d         50  %  NFT:       3.7  mm  LV:        7.8  mm  CM:        4.1  mm  Est. FW:     329  gm    0 lb 12 oz      73  % ---------------------------------------------------------------------- OB History  Gravidity:    1         Term:   0        Prem:   0        SAB:   0  TOP:          0       Ectopic:  0        Living: 0 ---------------------------------------------------------------------- Gestational Age  LMP:           19w 4d        Date:  03/23/23                  EDD:   12/28/23  U/S Today:     19w 6d                                        EDD:   12/26/23  Best:          19w 4d     Det. By:  LMP  (03/23/23)          EDD:   12/28/23 ---------------------------------------------------------------------- Targeted  Anatomy  Central Nervous System  Calvarium/Cranial V.:  Appears normal         Cereb./Vermis:          Appears normal  Cavum:                 Appears normal         Cisterna Magna:         Appears normal  Lateral Ventricles:    Appears normal         Midline Falx:           Appears normal  Choroid Plexus:        Appears normal  Spine  Cervical:              Appears normal         Sacral:                 Appears normal  Thoracic:              Appears normal  Shape/Curvature:        Not well visualized  Lumbar:                Appears normal  Head/Neck  Lips:                  Appears normal         Profile:                Appears normal  Neck:                  Appears normal         Orbits/Eyes:            Appears normal  Nuchal Fold:           Appears normal         Mandible:               Appears normal  Nasal Bone:            Present                Maxilla:                Appears normal  Thorax  4 Chamber View:        Appears normal         Interventr. Septum:     Appears normal  Cardiac Rhythm:        Normal                 Cardiac Axis:           Normal  Cardiac Situs:         Appears normal         Diaphragm:              Appears normal  Rt Outflow Tract:      Appears normal         3 Vessel View:          Appears normal  Lt Outflow Tract:      Appears normal         3 V Trachea View:       Appears normal  Aortic Arch:           Appears normal         IVC:                    Appears normal  Ductal Arch:           Appears normal         Crossing:               Appears normal  SVC:                   Appears normal  Abdomen  Ventral Wall:          Appears normal         Lt Kidney:              Appears normal  Cord Insertion:        Appears normal         Rt Kidney:              Appears normal  Situs:                 Appears normal         Bladder:  Appears normal  Stomach:               Appears normal  Extremities  Lt Humerus:            Appears normal         Lt Femur:               Appears  normal  Rt Humerus:            Appears normal         Rt Femur:               Appears normal  Lt Forearm:            Appears normal         Lt Lower Leg:           Appears normal  Rt Forearm:            Appears normal         Rt Lower Leg:           Appears normal  Lt Hand:               Open hand nml          Lt Foot:                Nml heel/foot  Rt Hand:               Open hand nml          Rt Foot:                Nml heel/foot  Other  Umbilical Cord:        Normal 3-vessel        Genitalia:              Female-nml ---------------------------------------------------------------------- Cervix Uterus Adnexa  Cervix  Length:            3.6  cm.  Normal appearance by transabdominal scan  Uterus  Intrauterine synechiae seen. LUS  Right Ovary  Size(cm)        4   x   2.67   x  2.17      Vol(ml): 12.13  Within normal limits.  Left Ovary  Size(cm)     3.47   x   2.69   x  1.52      Vol(ml): 7.43  Within normal limits.  Adnexa  No abnormality visualized ---------------------------------------------------------------------- Comments  Maternal Fetal Medicine Consult  Sandy Sullivan is a 38 y.o. G1P0000 at [redacted]w[redacted]d here  for ultrasound and consultation. She declined all genetic  testing.  Today we focused on the following:  Factor V Leiden: The patient reports that her mother is  homozygous for factor V Leiden so the patient was tested  and was noted to be heterozygous.  I discussed that this is  not a strong risk factor for thromboembolic event when  isolated in the absence of a personal history of thrombosis.  The patient denies any personal history of thrombosis.  I  discussed that this risk factor is not enough to warrant  anticoagulation during pregnancy or postpartum unless there  is an additional risk factor such as a cesarean delivery  History of ductal carcinoma in situ: This was treated back in  2019 she is now status postsurgery in the form of bilateral  mastectomy and radiation therapy with GnRH therapy until  2022.   The tumor was ER+, PR +,  grade 2-3.she had bilateral  skin sparing mastectomies, left deep axillary sentinel lymph  node biopsy with immediate tissue expander placement.  Pathology revealed multifocal positive anterior margins and  2/2 involved lymph nodes with ITC.  She is doing well today  without any signs or symptoms of recurrence.  She knows to  notify her oncologist with any concerns.  This should not  impact her pregnancy in any negative way since she is far  enough out from her initial treatment.  Advanced maternal age: Based on her age alone her risk of  having a fetus with Down syndrome is approximately 1/200.  Her risk of any aneuploidy during pregnancy is slightly higher  than this.  She understands that ultrasound alone cannot  diagnose genetic problems or abnormalities.  She is  comfortable with these results and does not desire further  screening or testing at this time but will consider her options  and let her OB provider know if she changes her mind.  Hemochromatosis: This is a genetic condition inherited in an  autosomal recessive manner. This condition is associated  with hypertensive disorders of pregnancy as well as  thromboembolic events but is a weak risk factor overall.  As  long as the patient is able to remain active I do not think  anticoagulation is necessary during the pregnancy but I did  give the patient the option to choose anticoagulation at any  time if she thinks she is less active or would like additional  thromboembolic protection during the pregnancy.  I  specifically discussed the risks of Lovenox include pain at the  injection site, hematoma formation and inconvenience of  administration.  The risks would include increased risk of  bleeding as well as complications of neuraxial analgesia if  required.  The patient will let her OB provider know if she  desires Lovenox at any time based on her current risk  factors.  Sonographic findings  Single intrauterine pregnancy at 19w  4d.  Fetal cardiac activity:  Observed and appears normal.  Presentation: Breech.  The anatomic structures that were well seen appear normal  without evidence of soft markers. The anatomic survey is  complete except for the placental cord insertion.  Fetal biometry shows the estimated fetal weight at the 73  percentile.  Amniotic fluid: Within normal limits.  MVP: 5.48 cm.  Placenta: Right lateral.  Adnexa: No abnormality visualized.  Cervical length: 3.6 cm.  There are limitations of prenatal ultrasound such as the  inability to detect certain abnormalities due to poor  visualization. Various factors such as fetal position,  gestational age and maternal body habitus may increase the  difficulty in visualizing the fetal anatomy.  Recommendations  -EDD should be 12/28/2023 based on  LMP  (03/23/23).  - Aneuploidy screening was offered again today but declined.  If the patient desires genetic testing please refer back to  genetic counseling for aneuploidy screening and carrier  screening for hemochromatosis.  - Anatomy ultrasound was done today with the above findings  (see report).  - Based on the current risk factors of factor V Leiden  heterozygous, hemochromatosis in pregnancy it is unclear if  pharmacologic anticoagulation is needed during the  pregnancy.  If the patient were to become significantly less  active then anticoagulation should be considered during the  pregnancy and postpartum period.  Anticoagulation is  recommended in the setting of a cesarean delivery for at  least 6 weeks following delivery.  -Follow-up growth ultrasound in  4 to 5 weeks to assess the  placental cord insertion site  - Delivery timing pending clinical course ----------------------------------------------------------------------                 Delora Smaller, DO Electronically Signed Final Report   08/07/2023 04:22 pm ----------------------------------------------------------------------

## 2023-09-02 NOTE — Addendum Note (Signed)
 Addended by: BABARA CALL on: 09/02/2023 10:57 PM   Modules accepted: Orders

## 2023-09-02 NOTE — Assessment & Plan Note (Addendum)
 Heterozygous S65C hemochromatosis mutation. Lab Results  Component Value Date   HGB 12.5 09/02/2023   TIBC 447 09/02/2023   IRONPCTSAT 14 09/02/2023   FERRITIN 30 09/02/2023    She has a history of elevated ferritin, normal iron saturation. Today's iron panel showed normal ferritin level and iron saturation. Normal energy.  No signs of iron overload. No need for any treatments.

## 2023-09-02 NOTE — Assessment & Plan Note (Addendum)
 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 Continue follow-up with [facility].

## 2023-09-02 NOTE — Assessment & Plan Note (Signed)
 Heterozygous factor V Leiden mutation, no personal history of thrombosis.  family history of DVT. Recommend Lovenox prophylaxis for up to 6 weeks if patient gets C-section.

## 2023-09-03 NOTE — Telephone Encounter (Signed)
 Patient called triage stating she was returning a call.  Tried reaching out to inform  her of MD recommendation but no answer and not able to leave voicemail.

## 2023-09-04 NOTE — Progress Notes (Signed)
 Pt has been scheduled.

## 2023-09-12 ENCOUNTER — Ambulatory Visit

## 2023-09-13 ENCOUNTER — Ambulatory Visit

## 2023-09-13 ENCOUNTER — Ambulatory Visit: Attending: Obstetrics and Gynecology | Admitting: Maternal & Fetal Medicine

## 2023-09-13 ENCOUNTER — Other Ambulatory Visit: Payer: Self-pay | Admitting: *Deleted

## 2023-09-13 VITALS — BP 111/62

## 2023-09-13 DIAGNOSIS — O99112 Other diseases of the blood and blood-forming organs and certain disorders involving the immune mechanism complicating pregnancy, second trimester: Secondary | ICD-10-CM

## 2023-09-13 DIAGNOSIS — O43199 Other malformation of placenta, unspecified trimester: Secondary | ICD-10-CM

## 2023-09-13 DIAGNOSIS — D6859 Other primary thrombophilia: Secondary | ICD-10-CM | POA: Insufficient documentation

## 2023-09-13 DIAGNOSIS — O09512 Supervision of elderly primigravida, second trimester: Secondary | ICD-10-CM | POA: Diagnosis not present

## 2023-09-13 DIAGNOSIS — Z3A24 24 weeks gestation of pregnancy: Secondary | ICD-10-CM | POA: Diagnosis not present

## 2023-09-13 DIAGNOSIS — D6851 Activated protein C resistance: Secondary | ICD-10-CM | POA: Diagnosis not present

## 2023-09-13 DIAGNOSIS — O43192 Other malformation of placenta, second trimester: Secondary | ICD-10-CM

## 2023-09-13 DIAGNOSIS — O09522 Supervision of elderly multigravida, second trimester: Secondary | ICD-10-CM | POA: Diagnosis not present

## 2023-09-13 DIAGNOSIS — Z853 Personal history of malignant neoplasm of breast: Secondary | ICD-10-CM | POA: Insufficient documentation

## 2023-09-13 DIAGNOSIS — Z3689 Encounter for other specified antenatal screening: Secondary | ICD-10-CM

## 2023-09-13 DIAGNOSIS — O0990 Supervision of high risk pregnancy, unspecified, unspecified trimester: Secondary | ICD-10-CM

## 2023-09-13 NOTE — Progress Notes (Signed)
 MFM Consult Note  Sandy Sullivan is currently at 24 weeks and 6 days.  She has been followed due to advanced maternal age, maternal thrombophilia (heterozygous for factor V Leiden), history of breast cancer (ductal carcinoma in situ), and carrier for hemochromatosis.    She denies any problems since her last exam.  On today's exam, the overall EFW of 1 pound 11 ounces measures at the 54th percentile for her gestational age.    There was normal amniotic fluid noted.    There were no obvious fetal anomalies noted on today's exam.  The limitations of ultrasound in the detection of all anomalies was discussed.  A posterior succenturiate lobe of the placenta was noted on today's exam.  The main anterior lobe with the placenta appears within normal limits.  There were no signs of vasa previa noted today.  Care should be taken to remove both the anterior main lobe and the posterior succenturiate lobe of the placenta at the time of delivery.  Due to her underlying medical conditions, maternal thrombophilia, and the succenturiate lobe of the placenta, we will continue to follow her with growth ultrasounds throughout her pregnancy.  As the patient is a heterozygous carrier for the factor V Leiden gene mutation without a personal history of thrombosis, she will not require thromboprophylaxis during her pregnancy.  However, thromboprophylaxis is recommended should she undergo a cesarean delivery.    A follow-up growth scan was scheduled in 5 weeks.    The patient stated that all of her questions were answered today.  A total of 20 minutes was spent counseling and coordinating the care for this patient.  Greater than 50% of the time was spent in direct face-to-face contact.

## 2023-09-20 ENCOUNTER — Inpatient Hospital Stay (HOSPITAL_COMMUNITY)
Admission: AD | Admit: 2023-09-20 | Discharge: 2023-09-21 | Disposition: A | Discharge status: Home / Self Care | Attending: Obstetrics and Gynecology | Admitting: Obstetrics and Gynecology

## 2023-09-20 DIAGNOSIS — R0981 Nasal congestion: Secondary | ICD-10-CM | POA: Diagnosis not present

## 2023-09-20 DIAGNOSIS — R0600 Dyspnea, unspecified: Secondary | ICD-10-CM | POA: Insufficient documentation

## 2023-09-20 DIAGNOSIS — Z3A26 26 weeks gestation of pregnancy: Secondary | ICD-10-CM | POA: Insufficient documentation

## 2023-09-20 DIAGNOSIS — O26893 Other specified pregnancy related conditions, third trimester: Secondary | ICD-10-CM

## 2023-09-20 DIAGNOSIS — O0990 Supervision of high risk pregnancy, unspecified, unspecified trimester: Secondary | ICD-10-CM

## 2023-09-20 DIAGNOSIS — Z3689 Encounter for other specified antenatal screening: Secondary | ICD-10-CM | POA: Diagnosis not present

## 2023-09-20 DIAGNOSIS — O0992 Supervision of high risk pregnancy, unspecified, second trimester: Secondary | ICD-10-CM | POA: Insufficient documentation

## 2023-09-20 DIAGNOSIS — O26892 Other specified pregnancy related conditions, second trimester: Secondary | ICD-10-CM | POA: Insufficient documentation

## 2023-09-20 LAB — URINALYSIS, ROUTINE W REFLEX MICROSCOPIC
Bilirubin Urine: NEGATIVE
Glucose, UA: 150 mg/dL — AB
Hgb urine dipstick: NEGATIVE
Ketones, ur: NEGATIVE mg/dL
Leukocytes,Ua: NEGATIVE
Nitrite: NEGATIVE
Protein, ur: NEGATIVE mg/dL
Specific Gravity, Urine: 1.01 (ref 1.005–1.030)
pH: 7 (ref 5.0–8.0)

## 2023-09-20 LAB — RESP PANEL BY RT-PCR (RSV, FLU A&B, COVID)  RVPGX2
Influenza A by PCR: NEGATIVE
Influenza B by PCR: NEGATIVE
Resp Syncytial Virus by PCR: NEGATIVE
SARS Coronavirus 2 by RT PCR: NEGATIVE

## 2023-09-20 NOTE — MAU Note (Signed)
 Pt says she has SOB- all day - since 730pm worse - Nothing makes worse.  Since Sunday has had nasal congestion- and today started a cough. Itchy throat . Had tonsil stones on Sunday - husband also has symptoms except tonsil stones.   No fever  No cold meds ,  Says lymph nodes were swollen in neck on Wed - but not now.  Feels baby moving- No OB c/o . No nausea, no vomiting, no diarrhea  [facility]- she called Scientist, research (medical) .

## 2023-09-21 ENCOUNTER — Other Ambulatory Visit: Payer: Self-pay

## 2023-09-21 DIAGNOSIS — R0600 Dyspnea, unspecified: Secondary | ICD-10-CM

## 2023-09-21 DIAGNOSIS — R0981 Nasal congestion: Secondary | ICD-10-CM

## 2023-09-21 DIAGNOSIS — Z3689 Encounter for other specified antenatal screening: Secondary | ICD-10-CM

## 2023-09-21 DIAGNOSIS — Z3A26 26 weeks gestation of pregnancy: Secondary | ICD-10-CM

## 2023-09-21 DIAGNOSIS — O26892 Other specified pregnancy related conditions, second trimester: Secondary | ICD-10-CM | POA: Diagnosis not present

## 2023-09-21 DIAGNOSIS — O0992 Supervision of high risk pregnancy, unspecified, second trimester: Secondary | ICD-10-CM | POA: Diagnosis not present

## 2023-09-21 LAB — CBC
HCT: 32.8 % — ABNORMAL LOW (ref 36.0–46.0)
Hemoglobin: 11.5 g/dL — ABNORMAL LOW (ref 12.0–15.0)
MCH: 32.4 pg (ref 26.0–34.0)
MCHC: 35.1 g/dL (ref 30.0–36.0)
MCV: 92.4 fL (ref 80.0–100.0)
Platelets: 245 K/uL (ref 150–400)
RBC: 3.55 MIL/uL — ABNORMAL LOW (ref 3.87–5.11)
RDW: 12.3 % (ref 11.5–15.5)
WBC: 11.5 K/uL — ABNORMAL HIGH (ref 4.0–10.5)
nRBC: 0 % (ref 0.0–0.2)

## 2023-09-21 MED ORDER — LORATADINE 10 MG PO TABS: 10.0000 mg | ORAL_TABLET | Freq: Every day | ORAL | 0 refills | Status: AC

## 2023-09-21 MED ORDER — FLUTICASONE PROPIONATE 50 MCG/ACT NA SUSP
2.0000 | Freq: Every day | NASAL | 0 refills | Status: AC
Start: 2023-09-21 — End: 2023-10-01

## 2023-09-21 NOTE — Discharge Instructions (Signed)
 Follow-up with your OB provider on Monday, 09/23/2023

## 2023-09-21 NOTE — MAU Provider Note (Signed)
 Event Date/Time   First Provider Initiated Contact with Patient 09/21/23 0243      S [name] is a 38 y.o. G1P0000 patient who presents to MAU @ [redacted] weeks GA today with complaint of shortness of breath since 7:30 PM.  Patient reports that she has had nasal congestion, cough, itchy scratchy throat since Sunday.  She denies fever, chills and reports that she has not taken any over-the-counter medication for cough or cold.  She also reports that her family has all been sick as well when her symptoms began  She denies palpitations or chest pain and offers no OB complaints at this time.  Patient denies any vaginal bleeding, leaking of fluid, contractions, reports good fetal movements.     O BP 125/73 (BP Location: Right Arm)   Pulse 90   Temp 98.4 F (36.9 C) (Oral)   Resp 16   Ht 5' 4 (1.626 m)   Wt 74.2 kg   LMP 03/23/2023   SpO2 99%   BMI 28.06 kg/m  Physical Exam Vitals and nursing note reviewed. Exam conducted with a chaperone present.  Constitutional:      General: She is not in acute distress.    Appearance: Normal appearance. She is not ill-appearing.  HENT:     Head: Normocephalic.     Nose: Congestion present.  Cardiovascular:     Rate and Rhythm: Normal rate.  Pulmonary:     Effort: Pulmonary effort is normal.     Breath sounds: Normal breath sounds.  Abdominal:     Palpations: Abdomen is soft.     Tenderness: There is no abdominal tenderness.  Musculoskeletal:        General: Normal range of motion.     Cervical back: Normal range of motion.  Skin:    General: Skin is warm.  Neurological:     Mental Status: She is alert and oriented to person, place, and time.  Psychiatric:        Mood and Affect: Mood and affect normal. Affect is not flat.     FHRT: Cat 1 reactive for GA Baseline: 140-145 Variability: moderate Accelerations: present Decelerations: absent TOCO: NO CTX  MDM  HIGH   Prenatal chart reviewed Physical exam  performed Respiratory panel ordered for influenza, COVID, RSV ( Negative) UA obtained: Unremarkable CBC obtained to rule out infection (Unremarkable) EKG obtained: Normal sinus rhythm Throat culture obtained to rule out strep ( Pending at discharge) NST for gestational age and fetal reassurance (category 1 reactive for gestational age)  Patient declined chest x-ray at this time Will obtain CBC (patient declines waiting for results and would prefer to check her MyChart and follow-up with her primary OB provider on Monday, 09/23/2023)  Orders Placed This Encounter  Procedures   Resp panel by RT-PCR (RSV, Flu A&B, Covid) Anterior Nasal Swab    Standing Status:   Standing    Number of Occurrences:   1   Culture, group A strep    Standing Status:   Standing    Number of Occurrences:   1   Urinalysis, Routine w reflex microscopic -Urine, Clean Catch    Standing Status:   Standing    Number of Occurrences:   1    Specimen Source:   Urine, Clean Catch [76]   CBC    Standing Status:   Standing    Number of Occurrences:   1   EKG 12-Lead    Standing Status:   Standing  Number of Occurrences:   1   Discharge patient Discharge disposition: 01-Home or Self Care; Discharge patient date: 09/21/2023    Standing Status:   Standing    Number of Occurrences:   1    Discharge disposition:   01-Home or Self Care [1]    Discharge patient date:   09/21/2023     Results for orders placed or performed during the hospital encounter of 09/20/23 (from the past 48 hours)  Urinalysis, Routine w reflex microscopic -Urine, Clean Catch     Status: Abnormal   Collection Time: 09/20/23  9:25 PM  Result Value Ref Range   Color, Urine YELLOW YELLOW   APPearance CLEAR CLEAR   Specific Gravity, Urine 1.010 1.005 - 1.030   pH 7.0 5.0 - 8.0   Glucose, UA 150 (A) NEGATIVE mg/dL   Hgb urine dipstick NEGATIVE NEGATIVE   Bilirubin Urine NEGATIVE NEGATIVE   Ketones, ur NEGATIVE NEGATIVE mg/dL   Protein, ur NEGATIVE  NEGATIVE mg/dL   Nitrite NEGATIVE NEGATIVE   Leukocytes,Ua NEGATIVE NEGATIVE    Comment: Performed at [facility], [address], [place], [place] [address]  Resp panel by RT-PCR (RSV, Flu A&B, Covid) Urine, Clean Catch     Status: None   Collection Time: 09/20/23  9:37 PM   Specimen: Urine, Clean Catch; Nasal Swab  Result Value Ref Range   SARS Coronavirus 2 by RT PCR NEGATIVE NEGATIVE   Influenza A by PCR NEGATIVE NEGATIVE   Influenza B by PCR NEGATIVE NEGATIVE    Comment: (NOTE) The Xpert Xpress SARS-CoV-2/FLU/RSV plus assay is intended as an aid in the diagnosis of influenza from Nasopharyngeal swab specimens and should not be used as a sole basis for treatment. Nasal washings and aspirates are unacceptable for Xpert Xpress SARS-CoV-2/FLU/RSV testing.  Fact Sheet for Patients: BloggerCourse.com  Fact Sheet for Healthcare Providers: SeriousBroker.it  This test is not yet approved or cleared by the [place]  FDA and has been authorized for detection and/or diagnosis of SARS-CoV-2 by FDA under an Emergency Use Authorization (EUA). This EUA will remain in effect (meaning this test can be used) for the duration of the COVID-19 declaration under Section 564(b)(1) of the Act, 21 U.S.C. section 360bbb-3(b)(1), unless the authorization is terminated or revoked.     Resp Syncytial Virus by PCR NEGATIVE NEGATIVE    Comment: (NOTE) Fact Sheet for Patients: BloggerCourse.com  Fact Sheet for Healthcare Providers: SeriousBroker.it  This test is not yet approved or cleared by the [place]  FDA and has been authorized for detection and/or diagnosis of SARS-CoV-2 by FDA under an Emergency Use Authorization (EUA). This EUA will remain in effect (meaning this test can be used) for the duration of the COVID-19 declaration under Section 564(b)(1) of the Act, 21 U.S.C. section  360bbb-3(b)(1), unless the authorization is terminated or revoked.  Performed at [facility], [address], [place], [place] [address]   CBC     Status: Abnormal   Collection Time: 09/21/23  3:30 AM  Result Value Ref Range   WBC 11.5 (H) 4.0 - 10.5 K/uL   RBC 3.55 (L) 3.87 - 5.11 MIL/uL   Hemoglobin 11.5 (L) 12.0 - 15.0 g/dL   HCT 67.1 (L) 63.9 - 53.9 %   MCV 92.4 80.0 - 100.0 fL   MCH 32.4 26.0 - 34.0 pg   MCHC 35.1 30.0 - 36.0 g/dL   RDW 87.6 88.4 - 84.4 %   Platelets 245 150 - 400 K/uL  nRBC 0.0 0.0 - 0.2 %    Comment: Performed at [facility], [address] [place], [place] [address]      Results for orders placed or performed during the hospital encounter of 09/20/23 (from the past 24 hours)  CBC     Status: Abnormal   Collection Time: 09/21/23  3:30 AM  Result Value Ref Range   WBC 11.5 (H) 4.0 - 10.5 K/uL   RBC 3.55 (L) 3.87 - 5.11 MIL/uL   Hemoglobin 11.5 (L) 12.0 - 15.0 g/dL   HCT 67.1 (L) 63.9 - 53.9 %   MCV 92.4 80.0 - 100.0 fL   MCH 32.4 26.0 - 34.0 pg   MCHC 35.1 30.0 - 36.0 g/dL   RDW 87.6 88.4 - 84.4 %   Platelets 245 150 - 400 K/uL   nRBC 0.0 0.0 - 0.2 %       I have reviewed the patient chart and performed the physical exam . I have ordered & interpreted the lab results and reviewed and interpreted the NST Medications ordered as stated below.  A/P as described below.  Counseling and education provided and patient agreeable  with plan as described below. Verbalized understanding.    ASSESSMENT Medical screening exam complete  Supervision of high risk pregnancy, antepartum  Gestational dyspnea in third trimester  Nasal sinus congestion  [redacted] weeks gestation of pregnancy  NST (non-stress test) reactive on fetal surveillance     PLAN Future Appointments  Date Time Provider Department Center  09/23/2023  3:50 PM Eldonna Suzen Octave, MD [facility]  10/08/2023  9:00 AM [facility]  10/08/2023   9:35 AM Izell Harari, MD [facility]  10/22/2023  3:50 PM Izell Harari, MD [facility]  10/24/2023  3:30 PM [facility] PROVIDER 1 [facility]  10/24/2023  3:45 PM [facility] US2 [facility]  11/05/2023  3:50 PM Anyanwu, Gloris LABOR, MD [facility]  03/05/2024  3:00 PM [facility] None  03/09/2024  3:30 PM Babara Call, MD [facility] None    Discharge from MAU in stable condition  See AVS for full description of educational information and instructions provided to the patient at time of discharge   Warning signs for worsening condition that would warrant emergency follow-up discussed Patient may return to MAU as needed   Littie Olam LABOR, NP 09/22/2023 2:44 AM

## 2023-09-23 ENCOUNTER — Encounter: Payer: Self-pay | Admitting: Family Medicine

## 2023-09-23 ENCOUNTER — Ambulatory Visit (INDEPENDENT_AMBULATORY_CARE_PROVIDER_SITE_OTHER): Admitting: Family Medicine

## 2023-09-23 VITALS — BP 111/76 | HR 86 | Wt 164.0 lb

## 2023-09-23 DIAGNOSIS — Z86000 Personal history of in-situ neoplasm of breast: Secondary | ICD-10-CM | POA: Diagnosis not present

## 2023-09-23 DIAGNOSIS — N856 Intrauterine synechiae: Secondary | ICD-10-CM

## 2023-09-23 DIAGNOSIS — O43193 Other malformation of placenta, third trimester: Secondary | ICD-10-CM | POA: Insufficient documentation

## 2023-09-23 DIAGNOSIS — D6851 Activated protein C resistance: Secondary | ICD-10-CM

## 2023-09-23 DIAGNOSIS — O09513 Supervision of elderly primigravida, third trimester: Secondary | ICD-10-CM

## 2023-09-23 DIAGNOSIS — O0990 Supervision of high risk pregnancy, unspecified, unspecified trimester: Secondary | ICD-10-CM

## 2023-09-23 NOTE — Progress Notes (Unsigned)
 PRENATAL VISIT NOTE  Subjective:  Sandy Sullivan is a 38 y.o. G1P0000 at [redacted]w[redacted]d being seen today for ongoing prenatal care.  She is currently monitored for the following issues for this high-risk pregnancy and has History of ductal carcinoma in situ (DCIS) of breast; AMA (advanced maternal age) primigravida 53+; Hereditary hemochromatosis (HCC); Factor 5 Leiden mutation, heterozygous (HCC); Supervision of high risk pregnancy, antepartum; Low TSH level; Elevated ferritin level; UTI in pregnancy, antepartum, second trimester; Intrauterine synechiae; and Placenta succenturiata, third trimester on their problem list.  Patient reports no complaints.  Contractions: Not present. Vag. Bleeding: None.  Movement: Present. Denies leaking of fluid.   The following portions of the patient's history were reviewed and updated as appropriate: allergies, current medications, past family history, past medical history, past social history, past surgical history and problem list.   Objective:    Vitals:   09/23/23 1603  BP: 111/76  Pulse: 86  Weight: 164 lb (74.4 kg)    Fetal Status:  Fetal Heart Rate (bpm): 148   Movement: Present    General: Alert, oriented and cooperative. Patient is in no acute distress.  Skin: Skin is warm and dry. No rash noted.   Cardiovascular: Normal heart rate noted  Respiratory: Normal respiratory effort, no problems with respiration noted  Abdomen: Soft, gravid, appropriate for gestational age.  Pain/Pressure: Absent     Pelvic: Cervical exam deferred        Extremities: Normal range of motion.  Edema: None  Mental Status: Normal mood and affect. Normal behavior. Normal judgment and thought content.   Assessment and Plan:  Pregnancy: G1P0000 at [redacted]w[redacted]d 1. Factor 5 Leiden mutation, heterozygous (HCC) (Primary) No anticoagulation unless CS/decreased mobility  2. Hereditary hemochromatosis (HCC) Elevated ferritin Monitor pp  3. History of ductal carcinoma in situ  (DCIS) of breast S/p mastectomy and XRT [facility] follows S/p bilateral masectomy  4. Intrauterine synechiae  5. Primigravida of advanced maternal age in third trimester  6. Supervision of high risk pregnancy, antepartum Up to date Feeling normal movement Fh appropriate Concerns today- wants to talk about US .  - questions about accreta, succenturiate lobe,  - also she bought the Fresh Test and she didn't realize we di 75g dose.  - She wants a hospital tour - She wanted to talk about delayed cord clamping - also provided feedback on her visit to the MAU and concerns about time it took her and others to be seen - interested in donor milk and getting milk rx-- deferred to pediatric provider   7. Placenta succenturiata, third trimester MFM for follow up US   Preterm labor symptoms and general obstetric precautions including but not limited to vaginal bleeding, contractions, leaking of fluid and fetal movement were reviewed in detail with the patient. Please refer to After Visit Summary for other counseling recommendations.   No follow-ups on file.  Future Appointments  Date Time Provider Department Center  10/08/2023  9:00 AM [facility]  10/08/2023  9:35 AM Izell Harari, MD [facility]  10/22/2023  3:50 PM Izell Harari, MD [facility]  10/24/2023  3:30 PM [facility] PROVIDER 1 [facility]  10/24/2023  3:45 PM [facility] US2 [facility]  11/05/2023  3:50 PM Anyanwu, Gloris LABOR, MD [facility]  11/19/2023  3:30 PM Fredirick Glenys RAMAN, MD [facility]  12/02/2023  3:30 PM Eldonna Suzen Octave, MD [facility]  03/05/2024  3:00 PM [facility] None  03/09/2024  3:30 PM Babara Call, MD [facility] None  Suzen Maryan Masters, MD

## 2023-09-23 NOTE — Progress Notes (Unsigned)
 ROB   Pt wants to discuss recent U/S from 09/13/23.

## 2023-09-24 LAB — CULTURE, GROUP A STREP (THRC)

## 2023-10-08 ENCOUNTER — Ambulatory Visit (INDEPENDENT_AMBULATORY_CARE_PROVIDER_SITE_OTHER): Admitting: Obstetrics and Gynecology

## 2023-10-08 ENCOUNTER — Encounter: Payer: Self-pay | Admitting: Obstetrics and Gynecology

## 2023-10-08 ENCOUNTER — Other Ambulatory Visit

## 2023-10-08 VITALS — BP 112/75 | HR 89 | Wt 165.0 lb

## 2023-10-08 DIAGNOSIS — O43193 Other malformation of placenta, third trimester: Secondary | ICD-10-CM

## 2023-10-08 DIAGNOSIS — Z86000 Personal history of in-situ neoplasm of breast: Secondary | ICD-10-CM

## 2023-10-08 DIAGNOSIS — O0990 Supervision of high risk pregnancy, unspecified, unspecified trimester: Secondary | ICD-10-CM

## 2023-10-08 DIAGNOSIS — D6851 Activated protein C resistance: Secondary | ICD-10-CM

## 2023-10-08 DIAGNOSIS — Z3A28 28 weeks gestation of pregnancy: Secondary | ICD-10-CM

## 2023-10-08 DIAGNOSIS — O2342 Unspecified infection of urinary tract in pregnancy, second trimester: Secondary | ICD-10-CM

## 2023-10-08 DIAGNOSIS — N856 Intrauterine synechiae: Secondary | ICD-10-CM

## 2023-10-08 DIAGNOSIS — R7989 Other specified abnormal findings of blood chemistry: Secondary | ICD-10-CM

## 2023-10-08 DIAGNOSIS — O43199 Other malformation of placenta, unspecified trimester: Secondary | ICD-10-CM | POA: Insufficient documentation

## 2023-10-08 DIAGNOSIS — O09513 Supervision of elderly primigravida, third trimester: Secondary | ICD-10-CM

## 2023-10-08 NOTE — Progress Notes (Signed)
 PRENATAL VISIT NOTE  Subjective:  Sandy Sullivan is a 38 y.o. G1P0000 at [redacted]w[redacted]d being seen today for ongoing prenatal care.  She is currently monitored for the following issues for this high-risk pregnancy and has History of ductal carcinoma in situ (DCIS) of breast; AMA (advanced maternal age) primigravida 19+; Hereditary hemochromatosis (HCC); Factor 5 Leiden mutation, heterozygous (HCC); Supervision of high risk pregnancy, antepartum; Low TSH level; Elevated ferritin level; UTI in pregnancy, antepartum, second trimester; Intrauterine synechiae; Placenta succenturiata, third trimester; and Marginal insertion of umbilical cord affecting management of mother on their problem list.  Patient reports: see below.  Contractions: Not present. Vag. Bleeding: None.  Movement: Present. Denies leaking of fluid.   The following portions of the patient's history were reviewed and updated as appropriate: allergies, current medications, past family history, past medical history, past social history, past surgical history and problem list.   Objective:    Vitals:   10/08/23 1011  BP: 112/75  Pulse: 89  Weight: 165 lb (74.8 kg)    Fetal Status:  Fetal Heart Rate (bpm): 142   Movement: Present    General: Alert, oriented and cooperative. Patient is in no acute distress.  Skin: Skin is warm and dry. No rash noted.   Cardiovascular: Normal heart rate noted  Respiratory: Normal respiratory effort, no problems with respiration noted  Abdomen: Soft, gravid, appropriate for gestational age.  Pain/Pressure: Absent     Pelvic: Cervical exam deferred        Extremities: Normal range of motion.  Edema: None  Mental Status: Normal mood and affect. Normal behavior. Normal judgment and thought content.   Assessment and Plan:  Pregnancy: G1P0000 at [redacted]w[redacted]d 1. Supervision of high risk pregnancy, antepartum (Primary) Patient reports occasional episodes of feeling lightening sensation in the crotch-area; I d/w her  likely from baby's movements and/or uterine growth/stretching  2. [redacted] weeks gestation of pregnancy 28wk labs today. Pt checking CBGs occasional during the pregnancy and noted a low fasting in the 60s today and sometimes can be in the high 90s with overnight snacking or carb heavy dinner and sometimes 1h 140s if carb heavy meal. Follow up formal GTT today  3. Factor 5 Leiden mutation, heterozygous (HCC) Lovenox six weeks post partum if has c-section/decreased mobility issues  4. Hereditary hemochromatosis (HCC) Followed by Onc. Normal ferritin late July. F/u panel today  5. History of ductal carcinoma in situ (DCIS) of breast See above  6. Primigravida of advanced maternal age in third trimester No issues  7. Elevated ferritin level See above  8. Intrauterine synechiae Seen at anatomy u/s. None seen at repeat seen   9. Placenta succenturiata, third trimester Noted last u/s with marginal cord insertion. Follow mfm serial scans.  8/8: efw 54% 778g, ac 84%, afi wnl Recommend post delivery u/s  10. Low TSH level Repeat today  11. UTI in pregnancy, antepartum, second trimester Neg ucx late july  12. Marginal insertion of umbilical cord affecting management of mother See above  Preterm labor symptoms and general obstetric precautions including but not limited to vaginal bleeding, contractions, leaking of fluid and fetal movement were reviewed in detail with the patient. Please refer to After Visit Summary for other counseling recommendations.   Return in about 2 weeks (around 10/22/2023).  Future Appointments  Date Time Provider Department Center  10/22/2023  3:50 PM Izell Harari, MD [facility]  10/24/2023  3:30 PM [facility] PROVIDER 1 [facility]  10/24/2023  3:45 PM [facility] US2 [facility]  11/05/2023  3:50 PM Anyanwu, Gloris LABOR, MD [facility]  11/19/2023  3:30 PM Fredirick Glenys RAMAN, MD [facility]  12/02/2023  3:30 PM Eldonna Suzen Octave, MD  [facility]  03/05/2024  3:00 PM [facility] None  03/09/2024  3:30 PM Babara Call, MD [facility] None    Bebe Furry, MD

## 2023-10-08 NOTE — Progress Notes (Signed)
 ROB   CC: lighting crotch since last night no cramping or pressure per pt just describes as stabbing pain.   Declines T-Dap.

## 2023-10-09 LAB — ANEMIA PROFILE B
Basophils Absolute: 0 x10E3/uL (ref 0.0–0.2)
Basos: 1 %
EOS (ABSOLUTE): 0.1 x10E3/uL (ref 0.0–0.4)
Eos: 1 %
Ferritin: 13 ng/mL — ABNORMAL LOW (ref 15–150)
Folate: 16 ng/mL (ref >3.0)
Hematocrit: 37.5 % (ref 34.0–46.6)
Hemoglobin: 12.4 g/dL (ref 11.1–15.9)
Immature Grans (Abs): 0 x10E3/uL (ref 0.0–0.1)
Immature Granulocytes: 0 %
Iron Saturation: 11 % — ABNORMAL LOW (ref 15–55)
Iron: 48 ug/dL (ref 27–159)
Lymphocytes Absolute: 1.8 x10E3/uL (ref 0.7–3.1)
Lymphs: 24 %
MCH: 32 pg (ref 26.6–33.0)
MCHC: 33.1 g/dL (ref 31.5–35.7)
MCV: 97 fL (ref 79–97)
Monocytes Absolute: 0.4 x10E3/uL (ref 0.1–0.9)
Monocytes: 5 %
Neutrophils Absolute: 5.3 x10E3/uL (ref 1.4–7.0)
Neutrophils: 69 %
Platelets: 226 x10E3/uL (ref 150–450)
RBC: 3.88 x10E6/uL (ref 3.77–5.28)
RDW: 12.5 % (ref 11.7–15.4)
Retic Ct Pct: 1.7 % (ref 0.6–2.6)
Total Iron Binding Capacity: 429 ug/dL (ref 250–450)
UIBC: 381 ug/dL (ref 131–425)
Vitamin B-12: 397 pg/mL (ref 232–1245)
WBC: 7.7 x10E3/uL (ref 3.4–10.8)

## 2023-10-09 LAB — HIV ANTIBODY (ROUTINE TESTING W REFLEX): HIV Screen 4th Generation wRfx: NONREACTIVE

## 2023-10-09 LAB — GLUCOSE TOLERANCE, 2 HOURS W/ 1HR
Glucose, 1 hour: 130 mg/dL (ref 70–179)
Glucose, 2 hour: 131 mg/dL (ref 70–152)
Glucose, Fasting: 81 mg/dL (ref 70–91)

## 2023-10-09 LAB — RPR: RPR Ser Ql: NONREACTIVE

## 2023-10-09 LAB — TSH RFX ON ABNORMAL TO FREE T4: TSH: 1.07 u[IU]/mL (ref 0.450–4.500)

## 2023-10-11 ENCOUNTER — Ambulatory Visit: Payer: Self-pay | Admitting: Family Medicine

## 2023-10-22 ENCOUNTER — Ambulatory Visit (INDEPENDENT_AMBULATORY_CARE_PROVIDER_SITE_OTHER): Admitting: Obstetrics and Gynecology

## 2023-10-22 VITALS — BP 118/79 | HR 80 | Wt 168.2 lb

## 2023-10-22 DIAGNOSIS — Z3A30 30 weeks gestation of pregnancy: Secondary | ICD-10-CM | POA: Diagnosis not present

## 2023-10-22 DIAGNOSIS — O43193 Other malformation of placenta, third trimester: Secondary | ICD-10-CM

## 2023-10-22 DIAGNOSIS — O43199 Other malformation of placenta, unspecified trimester: Secondary | ICD-10-CM

## 2023-10-22 DIAGNOSIS — R7989 Other specified abnormal findings of blood chemistry: Secondary | ICD-10-CM

## 2023-10-22 DIAGNOSIS — O0990 Supervision of high risk pregnancy, unspecified, unspecified trimester: Secondary | ICD-10-CM

## 2023-10-22 DIAGNOSIS — D6851 Activated protein C resistance: Secondary | ICD-10-CM

## 2023-10-22 DIAGNOSIS — Z86000 Personal history of in-situ neoplasm of breast: Secondary | ICD-10-CM

## 2023-10-22 DIAGNOSIS — N856 Intrauterine synechiae: Secondary | ICD-10-CM

## 2023-10-22 DIAGNOSIS — O2342 Unspecified infection of urinary tract in pregnancy, second trimester: Secondary | ICD-10-CM

## 2023-10-22 DIAGNOSIS — O09513 Supervision of elderly primigravida, third trimester: Secondary | ICD-10-CM

## 2023-10-22 NOTE — Progress Notes (Unsigned)
 Sandy Sullivan

## 2023-10-24 ENCOUNTER — Ambulatory Visit (HOSPITAL_BASED_OUTPATIENT_CLINIC_OR_DEPARTMENT_OTHER)

## 2023-10-24 ENCOUNTER — Other Ambulatory Visit: Payer: Self-pay | Admitting: *Deleted

## 2023-10-24 ENCOUNTER — Ambulatory Visit: Attending: Obstetrics | Admitting: Obstetrics and Gynecology

## 2023-10-24 VITALS — BP 125/78 | HR 84

## 2023-10-24 DIAGNOSIS — O09513 Supervision of elderly primigravida, third trimester: Secondary | ICD-10-CM | POA: Diagnosis not present

## 2023-10-24 DIAGNOSIS — D6851 Activated protein C resistance: Secondary | ICD-10-CM | POA: Diagnosis not present

## 2023-10-24 DIAGNOSIS — Z3A30 30 weeks gestation of pregnancy: Secondary | ICD-10-CM | POA: Diagnosis not present

## 2023-10-24 DIAGNOSIS — O99113 Other diseases of the blood and blood-forming organs and certain disorders involving the immune mechanism complicating pregnancy, third trimester: Secondary | ICD-10-CM

## 2023-10-24 DIAGNOSIS — O3663X0 Maternal care for excessive fetal growth, third trimester, not applicable or unspecified: Secondary | ICD-10-CM

## 2023-10-24 DIAGNOSIS — O43193 Other malformation of placenta, third trimester: Secondary | ICD-10-CM

## 2023-10-24 DIAGNOSIS — O43199 Other malformation of placenta, unspecified trimester: Secondary | ICD-10-CM

## 2023-10-24 DIAGNOSIS — O0990 Supervision of high risk pregnancy, unspecified, unspecified trimester: Secondary | ICD-10-CM

## 2023-10-24 DIAGNOSIS — O09523 Supervision of elderly multigravida, third trimester: Secondary | ICD-10-CM | POA: Diagnosis not present

## 2023-10-24 DIAGNOSIS — O09522 Supervision of elderly multigravida, second trimester: Secondary | ICD-10-CM

## 2023-10-24 NOTE — Progress Notes (Signed)
 MFM Consult Note

## 2023-10-24 NOTE — Progress Notes (Signed)
 Maternal-Fetal Medicine Consultation  Name: Sandy Sullivan  MRN: [id number]  GA: G1P0000 [redacted]w[redacted]d   Patient return for fetal growth assessment.  She does not have gestational diabetes. On previous ultrasound, marginal cord insertion and succenturiate lobe are seen.  Ultrasound Fetal growth is appropriate for gestational age.  Amniotic fluid is normal with good fetal activity seen.  Cephalic presentation. I strongly suspect posterior succenturiate lobe.  Marginal cord insertion is seen.  No evidence of velamentous cord insertion or vasa previa.  I counseled the couple on the conditions with the help of diagrams and ultrasound images.  Both these conditions can be associated with fetal growth restriction.  I reassured the patient that there is no evidence of fetal growth restriction. After delivery, the placenta will be carefully examined for any retained placenta.  If indicated, ultrasound may be performed. I discussed the limitations of ultrasound and accurately diagnosing succenturiate lobe and that it may not be present on postnatal examination.   Recommendations - An appointment was made for her to return in 6 weeks for fetal growth assessment.     Consultation including face-to-face (more than 50%) counseling 20 minutes.

## 2023-10-31 ENCOUNTER — Encounter: Payer: Self-pay | Admitting: Obstetrics and Gynecology

## 2023-10-31 ENCOUNTER — Ambulatory Visit (HOSPITAL_COMMUNITY)
Admission: RE | Admit: 2023-10-31 | Discharge: 2023-10-31 | Disposition: A | Discharge status: Home / Self Care | Source: Ambulatory Visit | Attending: Obstetrics & Gynecology | Admitting: Obstetrics & Gynecology

## 2023-10-31 ENCOUNTER — Ambulatory Visit: Payer: Self-pay | Admitting: Obstetrics & Gynecology

## 2023-10-31 ENCOUNTER — Other Ambulatory Visit: Payer: Self-pay | Admitting: *Deleted

## 2023-10-31 DIAGNOSIS — M79662 Pain in left lower leg: Secondary | ICD-10-CM | POA: Insufficient documentation

## 2023-10-31 DIAGNOSIS — O0990 Supervision of high risk pregnancy, unspecified, unspecified trimester: Secondary | ICD-10-CM | POA: Insufficient documentation

## 2023-10-31 DIAGNOSIS — D6851 Activated protein C resistance: Secondary | ICD-10-CM

## 2023-11-05 ENCOUNTER — Ambulatory Visit (INDEPENDENT_AMBULATORY_CARE_PROVIDER_SITE_OTHER): Admitting: Obstetrics & Gynecology

## 2023-11-05 VITALS — BP 117/76 | HR 82 | Wt 170.0 lb

## 2023-11-05 DIAGNOSIS — O43193 Other malformation of placenta, third trimester: Secondary | ICD-10-CM | POA: Diagnosis not present

## 2023-11-05 DIAGNOSIS — D682 Hereditary deficiency of other clotting factors: Secondary | ICD-10-CM

## 2023-11-05 DIAGNOSIS — O09513 Supervision of elderly primigravida, third trimester: Secondary | ICD-10-CM

## 2023-11-05 DIAGNOSIS — O0990 Supervision of high risk pregnancy, unspecified, unspecified trimester: Secondary | ICD-10-CM

## 2023-11-05 DIAGNOSIS — Z86000 Personal history of in-situ neoplasm of breast: Secondary | ICD-10-CM | POA: Diagnosis not present

## 2023-11-05 DIAGNOSIS — O43199 Other malformation of placenta, unspecified trimester: Secondary | ICD-10-CM

## 2023-11-05 DIAGNOSIS — Z3A32 32 weeks gestation of pregnancy: Secondary | ICD-10-CM

## 2023-11-05 NOTE — Progress Notes (Signed)
 PRENATAL VISIT NOTE  Subjective:  Sandy Sullivan is a 38 y.o. G1P0000 at [redacted]w[redacted]d being seen today for ongoing prenatal care.  She is currently monitored for the following issues for this high-risk pregnancy and has History of ductal carcinoma in situ (DCIS) of breast; AMA (advanced maternal age) primigravida 84+; Hereditary hemochromatosis; Factor 5 Leiden mutation, heterozygous; Supervision of high risk pregnancy, antepartum; Low TSH level; Elevated ferritin level; UTI in pregnancy, antepartum, second trimester; Intrauterine synechiae; Placenta succenturiata, third trimester; and Marginal insertion of umbilical cord affecting management of mother on their problem list.  Patient reports no significant concerns.  Contractions: Irritability. Vag. Bleeding: None.  Movement: Present. Denies leaking of fluid.   The following portions of the patient's history were reviewed and updated as appropriate: allergies, current medications, past family history, past medical history, past social history, past surgical history and problem list.   Objective:    Vitals:   11/05/23 1551  BP: 117/76  Pulse: 82  Weight: 170 lb (77.1 kg)    Fetal Status:  Fetal Heart Rate (bpm): 140   Movement: Present    General: Alert, oriented and cooperative. Patient is in no acute distress.  Skin: Skin is warm and dry. No rash noted.   Cardiovascular: Normal heart rate noted  Respiratory: Normal respiratory effort, no problems with respiration noted  Abdomen: Soft, gravid, appropriate for gestational age.  Pain/Pressure: Absent     Pelvic: Cervical exam deferred        Extremities: Normal range of motion.  Edema: Trace  Mental Status: Normal mood and affect. Normal behavior. Normal judgment and thought content.   VAS US  LOWER EXTREMITY VENOUS (DVT) Result Date: 10/31/2023  Lower Venous DVT Study Patient Name:  Sandy Sullivan  Date of Exam:   10/31/2023 Medical Rec #: [id number]         Accession #:    [id number]  Date of Birth: 15-Jul-1985          Patient Gender: F Patient Age:   65 years Exam Location:  [address] Procedure:      VAS US  LOWER EXTREMITY VENOUS (DVT) Referring Phys: GLORIS HUGGER --------------------------------------------------------------------------------  Other Indications: Patient [redacted] weeks pregnant and presents with new left calf                    pain and sciatica pain the last three weeks. Risk Factors: Factor V Leiden. Performing Technologist: Edsel Mustard RVT  Examination Guidelines: A complete evaluation includes B-mode imaging, spectral Doppler, color Doppler, and power Doppler as needed of all accessible portions of each vessel. Bilateral testing is considered an integral part of a complete examination. Limited examinations for reoccurring indications may be performed as noted. The reflux portion of the exam is performed with the patient in reverse Trendelenburg.  +-----+---------------+---------+-----------+----------+--------------+ RIGHTCompressibilityPhasicitySpontaneityPropertiesThrombus Aging +-----+---------------+---------+-----------+----------+--------------+ CFV  Full           Yes      Yes                                 +-----+---------------+---------+-----------+----------+--------------+   +---------+---------------+---------+-----------+----------+--------------+ LEFT     CompressibilityPhasicitySpontaneityPropertiesThrombus Aging +---------+---------------+---------+-----------+----------+--------------+ CFV      Full           Yes      Yes                                 +---------+---------------+---------+-----------+----------+--------------+  SFJ      Full           Yes      Yes                                 +---------+---------------+---------+-----------+----------+--------------+ FV Prox  Full           Yes      Yes                                  +---------+---------------+---------+-----------+----------+--------------+ FV Mid   Full           Yes      Yes                                 +---------+---------------+---------+-----------+----------+--------------+ FV DistalFull           Yes      Yes                                 +---------+---------------+---------+-----------+----------+--------------+ PFV      Full                                                        +---------+---------------+---------+-----------+----------+--------------+ POP      Full           Yes      Yes                                 +---------+---------------+---------+-----------+----------+--------------+ PTV      Full                                                        +---------+---------------+---------+-----------+----------+--------------+ PERO     Full                                                        +---------+---------------+---------+-----------+----------+--------------+ Gastroc  Full                                                        +---------+---------------+---------+-----------+----------+--------------+ GSV      Full                                                        +---------+---------------+---------+-----------+----------+--------------+    Summary: RIGHT: - No evidence of common femoral vein obstruction.  LEFT: - No evidence of deep vein thrombosis in the lower extremity. No indirect evidence of obstruction proximal to the inguinal ligament.   *See table(s) above for measurements and observations. Electronically signed by Fonda Rim on 10/31/2023 at 3:15:53 PM.    Final    US  MFM OB FOLLOW UP Result Date: 10/24/2023 ----------------------------------------------------------------------  OBSTETRICS REPORT                       (Signed Final 10/24/2023 06:54 pm) ---------------------------------------------------------------------- Patient Info  ID #:       [id number]                           D.O.B.:  02/25/1985 (38 yrs)(F)  Name:       Sandy Sullivan                Visit Date: 10/24/2023 03:30 pm ---------------------------------------------------------------------- Performed By  Attending:        Fredia Fresh MD        Ref. Address:     [address]                                                             [address]  Performed By:     Joyann Allen RDMS      [facility]  Referred By:      [facility] ---------------------------------------------------------------------- Orders  #  Description                           Code        Ordered By  1  US  MFM OB FOLLOW UP                   23183.98    BABARA KEYS ----------------------------------------------------------------------  #  Order #                     Accession #                Episode #  1  [id number]  [id number]                 [id number] ---------------------------------------------------------------------- Indications  Fetal and placental problem affecting          O36.90X0  management of mother, antepartum  Factor V Leiden mutation affecting             O99.119  pregnancy  Medical complication of pregnancy              O26.90  (Hereditary Hemochromatosis, history of  ductal carcinoma, s/p mastectomy)  Marginal insertion of umbilical cord affecting O43.193  management of mother in third trimester  Advanced maternal age primigravida 35+,        O8.513  third trimester (88)  Encounter for other antenatal screening        Z36.2  follow-up  [redacted] weeks gestation of pregnancy                Z3A.30 ---------------------------------------------------------------------- Vital Signs  BP:          125/78 ---------------------------------------------------------------------- Fetal Evaluation   Num Of Fetuses:         1  Fetal Heart Rate(bpm):  136  Cardiac Activity:       Observed  Presentation:           Cephalic  Placenta:               Succenturiate lobe  P. Cord Insertion:      Marginal insertion  Amniotic Fluid  AFI FV:      Within normal limits  AFI Sum(cm)     %Tile       Largest Pocket(cm)  10.05           15          3.9  RUQ(cm)       RLQ(cm)       LUQ(cm)        LLQ(cm)  2.95          3.9           0              3.2 ---------------------------------------------------------------------- Biometry  BPD:      76.8  mm     G. Age:  30w 6d         41  %    CI:        74.33   %    70 - 86                                                          FL/HC:      20.3   %    19.3 - 21.3  HC:      282.8  mm     G. Age:  31w 0d         22  %    HC/AC:      1.06        0.96 - 1.17  AC:      266.2  mm     G. Age:  30w 5d         47  %    FL/BPD:     74.9   %    71 - 87  FL:       57.5  mm     G. Age:  30w 1d         20  %    FL/AC:      21.6   %    20 - 24  LV:        6.1  mm  Est. FW:    1598  gm      3 lb 8 oz     33  % ---------------------------------------------------------------------- OB History  Gravidity:    1         Term:   0        Prem:   0        SAB:   0  TOP:          0       Ectopic:  0        Living: 0 ---------------------------------------------------------------------- Gestational Age  LMP:           30w 5d        Date:  03/23/23                  EDD:   12/28/23  U/S Today:     30w 5d                                        EDD:   12/28/23  Best:          30w 5d     Det. By:  LMP  (03/23/23)          EDD:   12/28/23 ---------------------------------------------------------------------- Anatomy  Cranium:               Previously seen        Aortic Arch:            Previously seen  Cavum:                 Previously seen        Ductal Arch:            Previously seen  Ventricles:            Appears normal         Diaphragm:              Appears normal  Choroid Plexus:        Previously seen         Stomach:                Appears normal, left                                                                        sided  Cerebellum:            Previously seen        Abdomen:                Previously seen  Posterior Fossa:       Previously seen        Abdominal Wall:         Previously seen  Face:  Orbits and profile     Cord Vessels:           Previously seen                         previously seen  Lips:                  Previously seen        Kidneys:                Appear normal  Thoracic:              Previously seen        Bladder:                Appears normal  Heart:                 Appears normal         Spine:                  Previously seen                         (4CH, axis, and                         situs)  RVOT:                  Previously seen        Upper Extremities:      Previously seen  LVOT:                  Previously seen        Lower Extremities:      Previously seen ---------------------------------------------------------------------- Cervix Uterus Adnexa  Cervix  Not visualized (advanced GA >24wks)  Uterus  No abnormality visualized.  Right Ovary  Not visualized.  Left Ovary  Not visualized.  Cul De Sac  No free fluid seen.  Adnexa  No abnormality visualized ---------------------------------------------------------------------- Impression  Patient return for fetal growth assessment.  She does not  have gestational diabetes.  On previous ultrasound, marginal cord insertion and  succenturiate lobe are seen.  Ultrasound  Fetal growth is appropriate for gestational age.  Amniotic fluid  is normal with good fetal activity seen.  Cephalic presentation.  I strongly suspect posterior succenturiate lobe.  Marginal cord  insertion is seen.  No evidence of velamentous cord insertion  or vasa previa.  I counseled the couple on the conditions with the help of  diagrams and ultrasound images.  Both these conditions can  be associated with fetal growth restriction.  I reassured  the  patient that there is no evidence of fetal growth restriction.  After delivery, the placenta will be carefully examined for any  retained placenta.  If indicated, ultrasound may be performed.  I discussed the limitations of ultrasound and accurately  diagnosing succenturiate lobe and that it may not be present  on postnatal examination. ---------------------------------------------------------------------- Recommendations  - An appointment was made for her to return in 6 weeks for  fetal growth assessment. ----------------------------------------------------------------------                 Fredia Fresh, MD Electronically Signed Final Report   10/24/2023 06:54 pm ----------------------------------------------------------------------    Assessment and Plan:  Pregnancy: G1P0000 at [redacted]w[redacted]d 1. Placenta succenturiata, third trimester (Primary) 2. Marginal insertion of umbilical cord affecting management of mother  Growth scan in November as per MFM Post delivery ultrasound recommended to assure no retained placental lobe.  3. History of ductal carcinoma in situ (DCIS) of breast Followed by [facility]. S/p surgery and XRT with [facility] therapy (2020-2022) Lactation consult postpartum.   4. Hereditary factor V deficiency disease (HCC) Prophylatic lovenox recommended during pregnancy if any issues with mobility and same for six weeks postpartum or if has cesarean section.    5. Hereditary hemochromatosis Followed by [facility], last visit 7/28 and had normal iron studies. No treatment.   6. Primigravida of advanced maternal age in third trimester 7. [redacted] weeks gestation of pregnancy 8. Supervision of high risk pregnancy, antepartum No other concerns.  Preterm labor symptoms and general obstetric precautions including but not limited to vaginal bleeding, contractions, leaking of fluid and fetal movement were reviewed in detail with the patient. Please refer to After Visit Summary for other  counseling recommendations.   Return in about 2 weeks (around 11/19/2023) for OFFICE OB VISIT (MD only).  Future Appointments  Date Time Provider Department Center  11/20/2023  8:15 AM Emilio Delilah HERO, CNM [facility]  12/02/2023  3:30 PM Eldonna Suzen Octave, MD [facility]  12/09/2023  3:30 PM Herchel Gloris LABOR, MD [facility]  12/10/2023  3:30 PM [facility] PROVIDER 1 [facility]  12/10/2023  3:45 PM [facility] US1 [facility]  12/16/2023  3:50 PM Erik Kieth BROCKS, MD [facility]  12/23/2023  3:50 PM Arleigh Dicola, Gloris LABOR, MD [facility]  03/05/2024  3:00 PM [facility] None  03/09/2024  3:30 PM Babara Call, MD [facility] None    Gloris Herchel, MD

## 2023-11-05 NOTE — Patient Instructions (Signed)
 Return to office for any scheduled appointments. Call the office or go to the MAU at [facility] if: You begin to have strong, frequent contractions Your water breaks.  Sometimes it is a big gush of fluid, sometimes it is just a trickle that keeps getting your underwear wet or running down your legs You have vaginal bleeding.  It is normal to have a small amount of spotting if your cervix was checked.  You do not feel your baby moving like normal.  If you do not, get something to eat and drink and lay down and focus on feeling your baby move.   If your baby is still not moving like normal, you should call the office or go to MAU. Any other obstetric concerns.     RSV Vaccination for Pregnant People  CDC recommends two ways to protect babies from getting very sick with Respiratory Syncytial Virus (RSV):  An RSV vaccination given during pregnancy  Pfizer's vaccine Verdis Frederickson) is recommended for use during pregnancy. It is given during RSV season to people who are 32 through [redacted] weeks pregnant.  Or, An RSV immunization given directly to infants and some older babies  Babies born to mothers who get RSV vaccine at least 2 weeks before delivery will have protection and, in most cases, should not need an RSV immunization later.    When is RSV season?  In most regions of the [place] RSV season starts in the fall and peaks in the winter, but the timing and severity of RSV season can vary from place to place and year to year.   The goal of maternal RSV vaccination is to protect babies from getting very sick with RSV during their first RSV season.  In most of the [place], this means maternal RSV vaccine will be given in September through January.  Who should get the maternal RSV vaccine?  People who are 79 through [redacted] weeks pregnant during September through January should get one dose of maternal RSV vaccine to protect their babies. RSV season  can vary around the country.   How is the maternal RSV vaccine administered?  Maternal RSV vaccine is given as a shot into the mother's upper arm. Only a single dose (one shot) of maternal RSV vaccine is recommended.   It is not yet known whether another dose might be needed in later pregnancies.  How well does the maternal RSV vaccine work?  When someone gets RSV vaccine, their body responds by making a protein that protects against the virus that causes RSV. The process takes about 2 weeks. When a pregnant person gets RSV vaccine, their protective proteins (called antibodies) also pass to their baby. So, babies who are born at least 2 weeks after their mother gets RSV vaccine are protected at birth, when infants are at the highest risk of severe RSV disease.   The vaccine can reduce a baby's risk of being hospitalized from RSV by 57% in the first six months after birth.  What are the possible side effects of the maternal RSV vaccine?  In the clinical trials, the side effects most often reported by pregnant people who received the maternal RSV vaccine were pain at the injection site, headache, muscle pain, and nausea.  Although not common, a dangerous high blood pressure condition called pre-eclampsia occurred in 1.8% of pregnant people who received the maternal RSV vaccine compared to 1.4% of pregnant people who received a placebo.  The clinical  trials identified a small increase in the number of preterm births in vaccinated pregnant people. It is not clear if this is a true safety problem related to RSV vaccine or if this occurred for reasons unrelated to vaccination.  To reduce the potential risk of preterm birth and complications from RSV disease, FDA approved the maternal RSV vaccine for use during weeks 32 through 30 of pregnancy while additional studies are conducted.  FDA is requiring the manufacturer to do additional studies that will look more closely at the potential risk of  preterm births and pregnancy-related high blood pressure issues in mothers, including pre-eclampsia.  Severe allergic reactions to vaccines are rare but can happen after any vaccine and can be life-threatening. If you see signs of a severe allergic reaction after vaccination (hives, swelling of the face and throat, difficulty breathing, a fast heartbeat, dizziness, or weakness), seek immediate medical care by calling 911.  As with any medicine or vaccine there is a very remote chance of the vaccine causing other serious injury or death after vaccination.  Adverse events following vaccination should be reported to the Vaccine Adverse Event Reporting System (VAERS), even if it's not clear that the vaccine caused the adverse event. You or your doctor can report an adverse event to [employer] and FDA through VAERS. If you need further assistance reporting to VAERS, please email [email or web address] [email or web address] or call [phone].  If you have any questions about side effects from the maternal RSV vaccine, talk with your healthcare provider.  Do I need a prescription for a maternal RSV vaccine?  Until the vaccine available in the office, you will need a prescription to take to a local pharmacy that is providing the vaccine.   How do I pay for the maternal RSV vaccine?  Most private health insurance plans cover the maternal RSV vaccine, but there may be a cost to you depending on your plan.  Contact your insurer to find out.  Medicaid Beginning November 05, 2021, most people with coverage from [facility] and [employer] [facility]) will be guaranteed coverage of all vaccines recommended by the Advisory Committee on Immunization Practice at no cost to them.   Source: [facility] for Immunization and Respiratory Diseases

## 2023-11-19 ENCOUNTER — Encounter: Admitting: Family Medicine

## 2023-11-20 ENCOUNTER — Ambulatory Visit (INDEPENDENT_AMBULATORY_CARE_PROVIDER_SITE_OTHER): Admitting: Certified Nurse Midwife

## 2023-11-20 VITALS — BP 122/79 | HR 76 | Wt 173.0 lb

## 2023-11-20 DIAGNOSIS — O26899 Other specified pregnancy related conditions, unspecified trimester: Secondary | ICD-10-CM

## 2023-11-20 DIAGNOSIS — O43199 Other malformation of placenta, unspecified trimester: Secondary | ICD-10-CM

## 2023-11-20 DIAGNOSIS — Z3A34 34 weeks gestation of pregnancy: Secondary | ICD-10-CM | POA: Diagnosis not present

## 2023-11-20 DIAGNOSIS — O43193 Other malformation of placenta, third trimester: Secondary | ICD-10-CM

## 2023-11-20 DIAGNOSIS — R1900 Intra-abdominal and pelvic swelling, mass and lump, unspecified site: Secondary | ICD-10-CM

## 2023-11-20 DIAGNOSIS — O0990 Supervision of high risk pregnancy, unspecified, unspecified trimester: Secondary | ICD-10-CM | POA: Diagnosis not present

## 2023-11-20 NOTE — Progress Notes (Signed)
 ROB   CC: pain in lower pelvic area notes a lump in area pt wants exam . Lump is not painful at all.

## 2023-11-20 NOTE — Progress Notes (Signed)
 PRENATAL VISIT NOTE  Subjective:  Sandy Sullivan is a 38 y.o. G1P0000 at [redacted]w[redacted]d being seen today for ongoing prenatal care.  She is currently monitored for the following issues for this high-risk pregnancy and has History of ductal carcinoma in situ (DCIS) of breast; AMA (advanced maternal age) primigravida 24+; Hereditary hemochromatosis; Factor 5 Leiden mutation, heterozygous; Supervision of high risk pregnancy, antepartum; Low TSH level; Elevated ferritin level; UTI in pregnancy, antepartum, second trimester; Intrauterine synechiae; Placenta succenturiata, third trimester; and Marginal insertion of umbilical cord affecting management of mother on their problem list.  Patient reports an intermittent burning and shooting pain in vagina. Denies frequency. Reports that she also feels a lump in same area. .  Contractions: Not present. Vag. Bleeding: None.  Movement: Present. Denies leaking of fluid.   The following portions of the patient's history were reviewed and updated as appropriate: allergies, current medications, past family history, past medical history, past social history, past surgical history and problem list.   Objective:    Vitals:   11/20/23 0822  BP: 122/79  Pulse: 76  Weight: 173 lb (78.5 kg)    Fetal Status:  Fetal Heart Rate (bpm): 136 Fundal Height: 33 cm Movement: Present    General: Alert, oriented and cooperative. Patient is in no acute distress.  Skin: Skin is warm and dry. No rash noted.   Cardiovascular: Normal heart rate noted  Respiratory: Normal respiratory effort, no problems with respiration noted  Abdomen: Soft, gravid, appropriate for gestational age.  Pain/Pressure: Absent     Pelvic: External pelvic exam performed in the presence of a chaperone L. Lash CMA      Minor skin abrasion from shaving but otherwise normal. No masses palpated.   Extremities: Normal range of motion.  Edema: None  Mental Status: Normal mood and affect. Normal behavior. Normal  judgment and thought content.   Assessment and Plan:  Pregnancy: G1P0000 at [redacted]w[redacted]d 1. Supervision of high risk pregnancy, antepartum (Primary) - Patient overall doing well.  - Reports vigorous and frequent fetal movement   2. [redacted] weeks gestation of pregnancy - Patient desires WB . -- Pt interested in waterbirth and has attended the class.  - Reviewed conditions in labor that will risk her out of water immersion including thick meconium or blood stained amniotic fluid, non-reassuring fetal status on monitor, excessive bleeding, hypertension, dizziness, use of IV meds, damaged equipment or staffing that does not allow for water immersion, etc.  - The attending midwife must be on the unit for water immersion to begin; pt understands this may delay the start of water immersion. - Reminded pt that signing consent in labor at the hospital also acknowledges they will exit the tub if the attending midwife requests. - Consent given to patient for review.  Consent will be reviewed and signed at the hospital by the waterbirth provider prior to use of the tub. - Discussed other labor support options if waterbirth becomes unavailable, including position change, freedom of movement, use of birthing ball, and/or use of hydrotherapy in the shower (dependent upon medical condition/provider discretion).   - Message sent to Dr. Eldonna to discuss [facility] candidacy given factor V and risk for DVT.   3. Pelvic mass during pregnancy - No masses palpated on exam today. Continue to monitor  - Discussed SPD and comfort measures.   5. Marginal insertion of umbilical cord affecting management of mother 4. Placenta succenturiata, third trimester - Growth scan in November as per MFM Post delivery ultrasound recommended  to assure no retained placental lobe.   Preterm labor symptoms and general obstetric precautions including but not limited to vaginal bleeding, contractions, leaking of fluid and fetal movement were reviewed  in detail with the patient. Please refer to After Visit Summary for other counseling recommendations.   Return in about 2 weeks (around 12/04/2023) for HROB w GBS, MD ONLY.  Future Appointments  Date Time Provider Department Center  12/02/2023  3:30 PM Eldonna Suzen Octave, MD [facility]  12/09/2023  3:30 PM Herchel Gloris LABOR, MD [facility]  12/10/2023  3:30 PM [facility] PROVIDER 1 [facility]  12/10/2023  3:45 PM [facility] US1 [facility]  12/16/2023  3:50 PM Erik Kieth BROCKS, MD [facility]  12/23/2023  3:50 PM Anyanwu, Gloris LABOR, MD [facility]  03/05/2024  3:00 PM [facility] None  03/09/2024  3:30 PM Babara Call, MD [facility] None    Cambry Spampinato Erven) Emilio, MSN, CNM  Center for [facility]  11/20/2023 12:17 PM

## 2023-12-02 ENCOUNTER — Other Ambulatory Visit (HOSPITAL_COMMUNITY)
Admission: RE | Admit: 2023-12-02 | Discharge: 2023-12-02 | Disposition: A | Discharge status: Home / Self Care | Source: Ambulatory Visit | Attending: Family Medicine | Admitting: Family Medicine

## 2023-12-02 ENCOUNTER — Ambulatory Visit (INDEPENDENT_AMBULATORY_CARE_PROVIDER_SITE_OTHER): Admitting: Family Medicine

## 2023-12-02 VITALS — BP 125/83 | HR 80 | Wt 176.0 lb

## 2023-12-02 DIAGNOSIS — D6851 Activated protein C resistance: Secondary | ICD-10-CM

## 2023-12-02 DIAGNOSIS — Z3A36 36 weeks gestation of pregnancy: Secondary | ICD-10-CM | POA: Diagnosis present

## 2023-12-02 DIAGNOSIS — Z86000 Personal history of in-situ neoplasm of breast: Secondary | ICD-10-CM

## 2023-12-02 DIAGNOSIS — O43193 Other malformation of placenta, third trimester: Secondary | ICD-10-CM

## 2023-12-02 DIAGNOSIS — O0990 Supervision of high risk pregnancy, unspecified, unspecified trimester: Secondary | ICD-10-CM | POA: Diagnosis present

## 2023-12-02 DIAGNOSIS — O09513 Supervision of elderly primigravida, third trimester: Secondary | ICD-10-CM

## 2023-12-02 DIAGNOSIS — O43199 Other malformation of placenta, unspecified trimester: Secondary | ICD-10-CM

## 2023-12-02 NOTE — Progress Notes (Signed)
 PRENATAL VISIT NOTE  Subjective:  Sandy Sullivan is a 38 y.o. G1P0000 at [redacted]w[redacted]d being seen today for ongoing prenatal care.  She is currently monitored for the following issues for this high-risk pregnancy and has History of ductal carcinoma in situ (DCIS) of breast; AMA (advanced maternal age) primigravida 46+; Hereditary hemochromatosis; Factor 5 Leiden mutation, heterozygous; Supervision of high risk pregnancy, antepartum; Low TSH level; Elevated ferritin level; UTI in pregnancy, antepartum, second trimester; Intrauterine synechiae; Placenta succenturiata, third trimester; and Marginal insertion of umbilical cord affecting management of mother on their problem list.  Patient reports no complaints.  Contractions: Not present. Vag. Bleeding: None.  Movement: Present. Denies leaking of fluid.   The following portions of the patient's history were reviewed and updated as appropriate: allergies, current medications, past family history, past medical history, past social history, past surgical history and problem list.   Objective:    Vitals:   12/02/23 1540  BP: 125/83  Pulse: 80  Weight: 176 lb (79.8 kg)    Fetal Status:  Fetal Heart Rate (bpm): 148 Fundal Height: 36 cm Movement: Present    General: Alert, oriented and cooperative. Patient is in no acute distress.  Skin: Skin is warm and dry. No rash noted.   Cardiovascular: Normal heart rate noted  Respiratory: Normal respiratory effort, no problems with respiration noted  Abdomen: Soft, gravid, appropriate for gestational age.  Pain/Pressure: Present     Pelvic: Cervical exam deferred        Extremities: Normal range of motion.  Edema: None  Mental Status: Normal mood and affect. Normal behavior. Normal judgment and thought content.   Assessment and Plan:  Pregnancy: G1P0000 at [redacted]w[redacted]d  1. Marginal insertion of umbilical cord affecting management of mother  2. History of ductal carcinoma in situ (DCIS) of breast S/p bl  masectomy Desiring donor milk  3. Hereditary hemochromatosis Does not exclude delivery in water, not at higher hemorrhage risk as platelets are not effected  4. Factor 5 Leiden mutation, heterozygous Discussed that this does not exclude from waterbirth Would recommend lovenox if CS  5. Placenta succenturiata, third trimester At risk for retained placental fragment  6. Primigravida of advanced maternal age in third trimester  7. Supervision of high risk pregnancy, antepartum (Primary) Up to date FH appropriate Vigorous movement- less intense movements now but same frequency  Questions about - Donor milk-- if mother had NSAIDs and ASA given patient is allergic to these and if she should ask for Hep B and C status from potential donor - Unassisted delivery of the placenta-- Reviewed active management of the thirst stage of labor recommendations. Reviewed also this all helps facilitate delivery of placenta within 20-30 minutes  - Strep Gp B NAA - Cervicovaginal ancillary only  - Pt interested in waterbirth and has attended the class.  - Reviewed conditions in labor that will risk her out of water immersion including thick meconium or blood stained amniotic fluid, non-reassuring fetal status on monitor, excessive bleeding, hypertension, dizziness, use of IV meds, damaged equipment or staffing that does not allow for water immersion, etc.  - The attending midwife must be on the unit for water immersion to begin; pt understands this may delay the start of water immersion. - Reminded pt that signing consent in labor at the hospital also acknowledges they will exit the tub if the attending midwife requests. - Consent given to patient for review.  Consent will be reviewed and signed at the hospital by the waterbirth provider  prior to use of the tub. - Discussed other labor support options if waterbirth becomes unavailable, including position change, freedom of movement, use of birthing ball,  and/or use of hydrotherapy in the shower (dependent upon medical condition/provider discretion).   8. [redacted] weeks gestation of pregnancy - Strep Gp B NAA - Cervicovaginal ancillary only   Preterm labor symptoms and general obstetric precautions including but not limited to vaginal bleeding, contractions, leaking of fluid and fetal movement were reviewed in detail with the patient. Please refer to After Visit Summary for other counseling recommendations.   No follow-ups on file.  Future Appointments  Date Time Provider Department Center  12/09/2023  3:30 PM Herchel Gloris LABOR, MD [facility]  12/10/2023  3:30 PM [facility] PROVIDER 1 [facility]  12/10/2023  3:45 PM [facility] US1 [facility]  12/16/2023  3:50 PM Erik Kieth BROCKS, MD [facility]  12/23/2023  3:50 PM Anyanwu, Gloris LABOR, MD [facility]  03/05/2024  3:00 PM [facility] None  03/09/2024  3:30 PM Babara Call, MD [facility] None    Suzen Maryan Masters, MD

## 2023-12-02 NOTE — Progress Notes (Signed)
 ROB   CC: None

## 2023-12-04 LAB — CERVICOVAGINAL ANCILLARY ONLY
Chlamydia: NEGATIVE
Comment: NEGATIVE
Comment: NORMAL
Neisseria Gonorrhea: NEGATIVE

## 2023-12-04 LAB — STREP GP B NAA: Strep Gp B NAA: NEGATIVE

## 2023-12-05 ENCOUNTER — Ambulatory Visit

## 2023-12-05 ENCOUNTER — Ambulatory Visit: Payer: Self-pay | Admitting: Family Medicine

## 2023-12-05 ENCOUNTER — Other Ambulatory Visit

## 2023-12-09 ENCOUNTER — Ambulatory Visit (INDEPENDENT_AMBULATORY_CARE_PROVIDER_SITE_OTHER): Admitting: Obstetrics & Gynecology

## 2023-12-09 ENCOUNTER — Encounter: Payer: Self-pay | Admitting: Obstetrics & Gynecology

## 2023-12-09 VITALS — BP 121/81 | HR 86 | Wt 178.0 lb

## 2023-12-09 DIAGNOSIS — D6851 Activated protein C resistance: Secondary | ICD-10-CM

## 2023-12-09 DIAGNOSIS — O43193 Other malformation of placenta, third trimester: Secondary | ICD-10-CM

## 2023-12-09 DIAGNOSIS — O0990 Supervision of high risk pregnancy, unspecified, unspecified trimester: Secondary | ICD-10-CM

## 2023-12-09 DIAGNOSIS — O09513 Supervision of elderly primigravida, third trimester: Secondary | ICD-10-CM

## 2023-12-09 DIAGNOSIS — Z86000 Personal history of in-situ neoplasm of breast: Secondary | ICD-10-CM

## 2023-12-09 DIAGNOSIS — O43199 Other malformation of placenta, unspecified trimester: Secondary | ICD-10-CM

## 2023-12-09 DIAGNOSIS — Z3A37 37 weeks gestation of pregnancy: Secondary | ICD-10-CM

## 2023-12-09 NOTE — Progress Notes (Signed)
 PRENATAL VISIT NOTE  Subjective:  Sandy Sullivan is a 38 y.o. G1P0000 at [redacted]w[redacted]d being seen today for ongoing prenatal care.  She is currently monitored for the following issues for this high-risk pregnancy and has History of ductal carcinoma in situ (DCIS) of breast; AMA (advanced maternal age) primigravida 71+; Hereditary hemochromatosis; Factor 5 Leiden mutation, heterozygous; Supervision of high risk pregnancy, antepartum; Low TSH level; Elevated ferritin level; UTI in pregnancy, antepartum, second trimester; Intrauterine synechiae; Placenta succenturiata, third trimester; and Marginal insertion of umbilical cord affecting management of mother on their problem list.  Patient reports no complaints.  Contractions: Not present. Vag. Bleeding: None.  Movement: Present. Denies leaking of fluid.   The following portions of the patient's history were reviewed and updated as appropriate: allergies, current medications, past family history, past medical history, past social history, past surgical history and problem list.   Objective:   Vitals:   12/09/23 1543  BP: 121/81  Pulse: 86  Weight: 178 lb (80.7 kg)    Fetal Status:  Fetal Heart Rate (bpm): 144   Movement: Present    General: Alert, oriented and cooperative. Patient is in no acute distress.  Skin: Skin is warm and dry. No rash noted.   Cardiovascular: Normal heart rate noted  Respiratory: Normal respiratory effort, no problems with respiration noted  Abdomen: Soft, gravid, appropriate for gestational age.  Pain/Pressure: Absent     Pelvic: Cervical exam deferred        Extremities: Normal range of motion.  Edema: Trace  Mental Status: Normal mood and affect. Normal behavior. Normal judgment and thought content.   VAS US  LOWER EXTREMITY VENOUS (DVT) Result Date: 10/31/2023  Lower Venous DVT Study Patient Name:  Sandy Sullivan  Date of Exam:   10/31/2023 Medical Rec #: [id number]         Accession #:    [id number] Date of Birth:  07/05/1985          Patient Gender: F Patient Age:   31 years Exam Location:  [address] Procedure:      VAS US  LOWER EXTREMITY VENOUS (DVT) Referring Phys: GLORIS HUGGER --------------------------------------------------------------------------------  Other Indications: Patient [redacted] weeks pregnant and presents with new left calf                    pain and sciatica pain the last three weeks. Risk Factors: Factor V Leiden. Performing Technologist: Edsel Mustard RVT  Examination Guidelines: A complete evaluation includes B-mode imaging, spectral Doppler, color Doppler, and power Doppler as needed of all accessible portions of each vessel. Bilateral testing is considered an integral part of a complete examination. Limited examinations for reoccurring indications may be performed as noted. The reflux portion of the exam is performed with the patient in reverse Trendelenburg.  +-----+---------------+---------+-----------+----------+--------------+ RIGHTCompressibilityPhasicitySpontaneityPropertiesThrombus Aging +-----+---------------+---------+-----------+----------+--------------+ CFV  Full           Yes      Yes                                 +-----+---------------+---------+-----------+----------+--------------+   +---------+---------------+---------+-----------+----------+--------------+ LEFT     CompressibilityPhasicitySpontaneityPropertiesThrombus Aging +---------+---------------+---------+-----------+----------+--------------+ CFV      Full           Yes      Yes                                 +---------+---------------+---------+-----------+----------+--------------+  SFJ      Full           Yes      Yes                                 +---------+---------------+---------+-----------+----------+--------------+ FV Prox  Full           Yes      Yes                                 +---------+---------------+---------+-----------+----------+--------------+ FV Mid    Full           Yes      Yes                                 +---------+---------------+---------+-----------+----------+--------------+ FV DistalFull           Yes      Yes                                 +---------+---------------+---------+-----------+----------+--------------+ PFV      Full                                                        +---------+---------------+---------+-----------+----------+--------------+ POP      Full           Yes      Yes                                 +---------+---------------+---------+-----------+----------+--------------+ PTV      Full                                                        +---------+---------------+---------+-----------+----------+--------------+ PERO     Full                                                        +---------+---------------+---------+-----------+----------+--------------+ Gastroc  Full                                                        +---------+---------------+---------+-----------+----------+--------------+ GSV      Full                                                        +---------+---------------+---------+-----------+----------+--------------+    Summary: RIGHT: - No evidence of common femoral vein obstruction.  LEFT: - No evidence of deep vein thrombosis in the lower extremity. No indirect evidence of obstruction proximal to the inguinal ligament.   *See table(s) above for measurements and observations. Electronically signed by Fonda Rim on 10/31/2023 at 3:15:53 PM.    Final    US  MFM OB FOLLOW UP Result Date: 10/24/2023 ----------------------------------------------------------------------  OBSTETRICS REPORT                       (Signed Final 10/24/2023 06:54 pm) ---------------------------------------------------------------------- Patient Info  ID #:       [id number]                          D.O.B.:  11-21-1985 (38 yrs)(F)  Name:       Sandy Sullivan                 Visit Date: 10/24/2023 03:30 pm ---------------------------------------------------------------------- Performed By  Attending:        Fredia Fresh MD        Ref. Address:     [address]                                                             [address]  Performed By:     Joyann Allen RDMS      [facility]  Referred By:      [facility] ---------------------------------------------------------------------- Orders  #  Description                           Code        Ordered By  1  US  MFM OB FOLLOW UP                   23183.98    BABARA KEYS ----------------------------------------------------------------------  #  Order #                     Accession #                Episode #  1  [id number]  [id number]                 [id number] ---------------------------------------------------------------------- Indications  Fetal and placental problem affecting          O36.90X0  management of mother, antepartum  Factor V Leiden mutation affecting             O99.119  pregnancy  Medical complication of pregnancy              O26.90  (Hereditary Hemochromatosis, history of  ductal carcinoma, s/p mastectomy)  Marginal insertion of umbilical cord affecting O43.193  management of mother in third trimester  Advanced maternal age primigravida 10+,        O41.513  third trimester (67)  Encounter for other antenatal screening        Z36.2  follow-up  [redacted] weeks gestation of pregnancy                Z3A.30 ---------------------------------------------------------------------- Vital Signs  BP:          125/78 ---------------------------------------------------------------------- Fetal Evaluation  Num Of Fetuses:         1  Fetal Heart Rate(bpm):  136  Cardiac Activity:        Observed  Presentation:           Cephalic  Placenta:               Succenturiate lobe  P. Cord Insertion:      Marginal insertion  Amniotic Fluid  AFI FV:      Within normal limits  AFI Sum(cm)     %Tile       Largest Pocket(cm)  10.05           15          3.9  RUQ(cm)       RLQ(cm)       LUQ(cm)        LLQ(cm)  2.95          3.9           0              3.2 ---------------------------------------------------------------------- Biometry  BPD:      76.8  mm     G. Age:  30w 6d         41  %    CI:        74.33   %    70 - 86                                                          FL/HC:      20.3   %    19.3 - 21.3  HC:      282.8  mm     G. Age:  31w 0d         22  %    HC/AC:      1.06        0.96 - 1.17  AC:      266.2  mm     G. Age:  30w 5d         47  %    FL/BPD:     74.9   %    71 - 87  FL:       57.5  mm     G. Age:  30w 1d         20  %    FL/AC:      21.6   %    20 - 24  LV:        6.1  mm  Est. FW:    1598  gm      3 lb 8 oz     33  % ---------------------------------------------------------------------- OB History  Gravidity:    1         Term:   0        Prem:   0        SAB:   0  TOP:          0       Ectopic:  0        Living: 0 ---------------------------------------------------------------------- Gestational Age  LMP:           30w 5d        Date:  03/23/23                  EDD:   12/28/23  U/S Today:     30w 5d                                        EDD:   12/28/23  Best:          30w 5d     Det. By:  LMP  (03/23/23)          EDD:   12/28/23 ---------------------------------------------------------------------- Anatomy  Cranium:               Previously seen        Aortic Arch:            Previously seen  Cavum:                 Previously seen        Ductal Arch:            Previously seen  Ventricles:            Appears normal         Diaphragm:              Appears normal  Choroid Plexus:        Previously seen        Stomach:                Appears normal, left                                                                         sided  Cerebellum:            Previously seen        Abdomen:                Previously seen  Posterior Fossa:       Previously seen        Abdominal Wall:         Previously seen  Face:  Orbits and profile     Cord Vessels:           Previously seen                         previously seen  Lips:                  Previously seen        Kidneys:                Appear normal  Thoracic:              Previously seen        Bladder:                Appears normal  Heart:                 Appears normal         Spine:                  Previously seen                         (4CH, axis, and                         situs)  RVOT:                  Previously seen        Upper Extremities:      Previously seen  LVOT:                  Previously seen        Lower Extremities:      Previously seen ---------------------------------------------------------------------- Cervix Uterus Adnexa  Cervix  Not visualized (advanced GA >24wks)  Uterus  No abnormality visualized.  Right Ovary  Not visualized.  Left Ovary  Not visualized.  Cul De Sac  No free fluid seen.  Adnexa  No abnormality visualized ---------------------------------------------------------------------- Impression  Patient return for fetal growth assessment.  She does not  have gestational diabetes.  On previous ultrasound, marginal cord insertion and  succenturiate lobe are seen.  Ultrasound  Fetal growth is appropriate for gestational age.  Amniotic fluid  is normal with good fetal activity seen.  Cephalic presentation.  I strongly suspect posterior succenturiate lobe.  Marginal cord  insertion is seen.  No evidence of velamentous cord insertion  or vasa previa.  I counseled the couple on the conditions with the help of  diagrams and ultrasound images.  Both these conditions can  be associated with fetal growth restriction.  I reassured the  patient that there is no evidence of fetal growth restriction.  After  delivery, the placenta will be carefully examined for any  retained placenta.  If indicated, ultrasound may be performed.  I discussed the limitations of ultrasound and accurately  diagnosing succenturiate lobe and that it may not be present  on postnatal examination. ---------------------------------------------------------------------- Recommendations  - An appointment was made for her to return in 6 weeks for  fetal growth assessment. ----------------------------------------------------------------------                 Fredia Fresh, MD Electronically Signed Final Report   10/24/2023 06:54 pm ----------------------------------------------------------------------   US  MFM OB FOLLOW UP Result Date: 09/13/2023 ----------------------------------------------------------------------  OBSTETRICS REPORT                       (  Signed Final 09/13/2023 08:50 am) ---------------------------------------------------------------------- Patient Info  ID #:       [id number]                          D.O.B.:  15-Apr-1985 (38 yrs)(F)  Name:       Sandy Sullivan                Visit Date: 09/13/2023 07:21 am ---------------------------------------------------------------------- Performed By  Attending:        Steffan Keys MD         Ref. Address:     [address]                                                             [address]  Performed By:     Comer Harrow       [facility]  Referred By:      [facility] ---------------------------------------------------------------------- Orders  #  Description                           Code        Ordered By  1  US  MFM OB FOLLOW UP                   23183.98    Sandy Sullivan ----------------------------------------------------------------------  #   Order #                     Accession #                Episode #  1  [id number]                   [id number]                 [id number] ---------------------------------------------------------------------- Indications  Fetal and placental problem affecting          O36.90X0  management of mother, antepartum  Advanced maternal age primigravida 43+,        O23.512  second trimester  Factor V Leiden mutation affecting  O99.119  pregnancy  Medical complication of pregnancy              O26.90  (Hereditary Hemochromatosis, history of  ductal carcinoma, s/p mastectomy)  Encounter for other antenatal screening        Z36.2  follow-up  [redacted] weeks gestation of pregnancy                Z3A.24 ---------------------------------------------------------------------- Fetal Evaluation  Num Of Fetuses:         1  Fetal Heart Rate(bpm):  144  Cardiac Activity:       Observed  Presentation:           Cephalic  Placenta:               Right lateral, succenturiate lobe  P. Cord Insertion:      Marginal insertion  Amniotic Fluid  AFI FV:      Within normal limits                              Largest Pocket(cm)                              3.99 ---------------------------------------------------------------------- Biometry  BPD:      60.5  mm     G. Age:  24w 4d         34  %    CI:        76.16   %    70 - 86                                                          FL/HC:      19.4   %    18.7 - 20.3  HC:      219.7  mm     G. Age:  24w 0d          8  %    HC/AC:      1.00        1.04 - 1.22  AC:      218.8  mm     G. Age:  26w 2d         84  %    FL/BPD:     70.6   %    71 - 87  FL:       42.7  mm     G. Age:  24w 0d         13  %    FL/AC:      19.5   %    20 - 24  CER:      27.3  mm     G. Age:  24w 3d         43  %  LV:          7  mm  CM:        6.7  mm  Est. FW:     778  gm    1 lb 11 oz      54  % ---------------------------------------------------------------------- OB History  Gravidity:    1         Term:   0  Prem:   0        SAB:   0  TOP:          0       Ectopic:  0        Living: 0 ---------------------------------------------------------------------- Gestational Age  LMP:           24w 6d        Date:  03/23/23                  EDD:   12/28/23  U/S Today:     24w 5d                                        EDD:   12/29/23  Best:          24w 6d     Det. By:  LMP  (03/23/23)          EDD:   12/28/23 ---------------------------------------------------------------------- Anatomy  Cranium:               Appears normal         Aortic Arch:            Previously seen  Cavum:                 Appears normal         Ductal Arch:            Previously seen  Ventricles:            Appears normal         Diaphragm:              Appears normal  Choroid Plexus:        Previously seen        Stomach:                Appears normal, left                                                                        sided  Cerebellum:            Appears normal         Abdomen:                Appears normal  Posterior Fossa:       Appears normal         Abdominal Wall:         Previously seen  Face:                  Orbits and profile     Cord Vessels:           Previously seen                         previously seen  Lips:                  Appears normal         Kidneys:                Appear  normal  Thoracic:              Previously seen        Bladder:                Appears normal  Heart:                 Appears normal         Spine:                  Previously seen                         (4CH, axis, and                         situs)  RVOT:                  Previously seen        Upper Extremities:      Previously seen  LVOT:                  Previously seen        Lower Extremities:      Previously seen  Other:  Female gender previously seen. Fetal anatomic survey complete on          prior scans. ---------------------------------------------------------------------- Cervix Uterus Adnexa  Cervix  Length:            3.7  cm.  Normal  appearance by transabdominal scan  Uterus  No abnormality visualized.  Right Ovary  Size(cm)     3.24   x   2.96   x  2.89      Vol(ml): 14.51  Within normal limits.  Left Ovary  Size(cm)     3.37   x   2.36   x  1.37      Vol(ml): 5.71  Within normal limits.  Cul De Sac  No free fluid seen.  Adnexa  No adnexal mass visualized ---------------------------------------------------------------------- Comments  Sandy Sullivan is currently at 24 weeks and 6 days.  She  has been followed due to advanced maternal age, maternal  thrombophilia (heterozygous for factor V Leiden), history of  breast cancer (ductal carcinoma in situ), and carrier for  hemochromatosis.  She denies any problems since her last exam.  On today's exam, the overall EFW of 1 pound 11 ounces  measures at the 54th percentile for her gestational age.  There was normal amniotic fluid noted.  There were no obvious fetal anomalies noted on today's  exam.  The limitations of ultrasound in the detection of all  anomalies was discussed.  A posterior succenturiate lobe of the placenta was noted on  today's exam.  The main anterior lobe with the placenta  appears within normal limits.  There were no signs of vasa  previa noted today.  Care should be taken to remove both the anterior main lobe  and the posterior succenturiate lobe of the placenta at the  time of delivery.  Due to her underlying medical conditions, maternal  thrombophilia, and the succenturiate lobe of the placenta, we  will continue to follow her with growth ultrasounds throughout  her pregnancy.  As the patient is a heterozygous carrier for the factor V  Leiden gene mutation without a personal history of  thrombosis, she will not require thromboprophylaxis during  her pregnancy.  However, thromboprophylaxis is  recommended should  she undergo a cesarean delivery.  A follow-up growth scan was scheduled in 5 weeks.  The patient stated that all of her questions were answered  today.  A total of 20  minutes was spent counseling and coordinating  the care for this patient.  Greater than 50% of the time was  spent in direct face-to-face contact. ----------------------------------------------------------------------                  Steffan Keys, MD Electronically Signed Final Report   09/13/2023 08:50 am ----------------------------------------------------------------------     Assessment and Plan:  Pregnancy: G1P0000 at [redacted]w[redacted]d 1. Marginal insertion of umbilical cord affecting management of mother (Primary) Growth scan on 12/10/23  2. History of ductal carcinoma in situ (DCIS) of breast Followed by [facility]. S/p surgery and XRT with [facility] therapy (2020-2022) Lactation consult postpartum.   3. Hereditary hemochromatosis Followed by [facility], last visit 7/28 and had normal iron studies. No treatment.   4. Factor 5 Leiden mutation, heterozygous Prophylatic lovenox recommended during pregnancy if any issues with mobility and same for six weeks postpartum or if has cesarean section.   5. Placenta succenturiata, third trimester Post delivery ultrasound recommended to assure no retained placental lobe.   6. Primigravida of advanced maternal age in third trimester 7. [redacted] weeks gestation of pregnancy 8. Supervision of high risk pregnancy, antepartum Talked about delivery process and questions answered about what happens immediately postpartum and addressed various other concerns. L abor symptoms and general obstetric precautions including but not limited to vaginal bleeding, contractions, leaking of fluid and fetal movement were reviewed in detail with the patient. Please refer to After Visit Summary for other counseling recommendations.   Return in about 1 week (around 12/16/2023) for OFFICE OB VISIT (MD only).  Future Appointments  Date Time Provider Department Center  12/10/2023  3:30 PM [facility] PROVIDER 1 [facility]  12/10/2023  3:45 PM [facility]1 [facility]  12/16/2023   3:50 PM Erik Kieth BROCKS, MD [facility]  12/23/2023  3:50 PM Neiko Trivedi, Gloris LABOR, MD [facility]  03/05/2024  3:00 PM [facility] None  03/09/2024  3:30 PM Babara Call, MD [facility] None    Gloris Hugger, MD

## 2023-12-09 NOTE — Patient Instructions (Signed)

## 2023-12-10 ENCOUNTER — Ambulatory Visit: Attending: Obstetrics and Gynecology | Admitting: Maternal & Fetal Medicine

## 2023-12-10 ENCOUNTER — Ambulatory Visit

## 2023-12-10 VITALS — BP 123/81 | HR 89

## 2023-12-10 DIAGNOSIS — O0990 Supervision of high risk pregnancy, unspecified, unspecified trimester: Secondary | ICD-10-CM

## 2023-12-10 DIAGNOSIS — Z3A37 37 weeks gestation of pregnancy: Secondary | ICD-10-CM | POA: Diagnosis not present

## 2023-12-10 DIAGNOSIS — Z362 Encounter for other antenatal screening follow-up: Secondary | ICD-10-CM | POA: Insufficient documentation

## 2023-12-10 DIAGNOSIS — O99113 Other diseases of the blood and blood-forming organs and certain disorders involving the immune mechanism complicating pregnancy, third trimester: Secondary | ICD-10-CM | POA: Insufficient documentation

## 2023-12-10 DIAGNOSIS — O43199 Other malformation of placenta, unspecified trimester: Secondary | ICD-10-CM

## 2023-12-10 DIAGNOSIS — O2693 Pregnancy related conditions, unspecified, third trimester: Secondary | ICD-10-CM | POA: Diagnosis not present

## 2023-12-10 DIAGNOSIS — O43193 Other malformation of placenta, third trimester: Secondary | ICD-10-CM | POA: Diagnosis not present

## 2023-12-10 DIAGNOSIS — Z9010 Acquired absence of unspecified breast and nipple: Secondary | ICD-10-CM | POA: Insufficient documentation

## 2023-12-10 DIAGNOSIS — O09513 Supervision of elderly primigravida, third trimester: Secondary | ICD-10-CM

## 2023-12-10 DIAGNOSIS — O3693X0 Maternal care for fetal problem, unspecified, third trimester, not applicable or unspecified: Secondary | ICD-10-CM | POA: Insufficient documentation

## 2023-12-10 DIAGNOSIS — D66 Hereditary factor VIII deficiency: Secondary | ICD-10-CM

## 2023-12-10 NOTE — Progress Notes (Signed)
   Patient information  Patient Name: Sandy Sullivan  Patient MRN:   [id number]  Referring practice: MFM Referring Provider: [facility]  Problem List   Patient Active Problem List   Diagnosis Date Noted   Marginal insertion of umbilical cord affecting management of mother 10/08/2023   Placenta succenturiata, third trimester 09/23/2023   Intrauterine synechiae 08/12/2023   UTI in pregnancy, antepartum, second trimester 07/12/2023   Low TSH level 06/25/2023   Elevated ferritin level 06/25/2023   Supervision of high risk pregnancy, antepartum 06/18/2023   AMA (advanced maternal age) primigravida 35+ 06/14/2023   Hereditary hemochromatosis 06/14/2023   Factor 5 Leiden mutation, heterozygous 06/14/2023   History of ductal carcinoma in situ (DCIS) of breast 09/20/2017    Maternal Fetal medicine Consult  Arrie Burack is a 38 y.o. G1P0000 at [redacted]w[redacted]d here for ultrasound and consultation. Naomee Fofana is doing well today with no acute concerns. Today we focused on the following:   The patient is here for growth ultrasound due to marginal cord insertion.  I confirmed that the fetal growth is in the normal range.  The patient was curious about the lower percentiles for the fetal head and femurs.  I discussed that this is common during pregnancy and there is no evidence of fracture, bowing or evidence of a skeletal dysplasia.  She would like to avoid induction of labor which is reasonable.  If she does go past her due date she should have antenatal testing weekly.  I gave the patient standard OB precautions and she will follow-up with her OB provider  The patient had time to ask questions that were answered to her satisfaction.  She verbalized understanding and agrees to proceed with the plan below.  Recommendations - Weekly antenatal testing if the patient goes beyond her due date.  Review of Systems: A review of systems was performed and was negative except per HPI    Vitals and Physical Exam    12/10/2023    4:10 PM 12/09/2023    3:43 PM 12/02/2023    3:40 PM  Vitals with BMI  Weight  178 lbs 176 lbs  Systolic 123 121 874  Diastolic 81 81 83  Pulse 89 86 80    Sitting comfortably on the sonogram table Nonlabored breathing Normal rate and rhythm Abdomen is nontender  Past pregnancies OB History  Gravida Para Term Preterm AB Living  1 0 0 0 0 0  SAB IAB Ectopic Multiple Live Births  0 0 0 0 0    # Outcome Date GA Lbr Len/2nd Weight Sex Type Anes PTL Lv  1 Current             Obstetric Comments  1st Menstrual Cycle:  12        I spent 20 minutes reviewing the patients chart, including labs and images as well as counseling the patient about her medical conditions. Greater than 50% of the time was spent in direct face-to-face patient counseling.  Delora Smaller  MFM, [facility]   12/10/2023  4:14 PM

## 2023-12-16 ENCOUNTER — Ambulatory Visit: Admitting: Obstetrics and Gynecology

## 2023-12-16 VITALS — BP 122/84 | HR 76 | Wt 180.1 lb

## 2023-12-16 DIAGNOSIS — O09513 Supervision of elderly primigravida, third trimester: Secondary | ICD-10-CM

## 2023-12-16 DIAGNOSIS — Z86000 Personal history of in-situ neoplasm of breast: Secondary | ICD-10-CM

## 2023-12-16 DIAGNOSIS — O43193 Other malformation of placenta, third trimester: Secondary | ICD-10-CM

## 2023-12-16 DIAGNOSIS — O0990 Supervision of high risk pregnancy, unspecified, unspecified trimester: Secondary | ICD-10-CM | POA: Diagnosis not present

## 2023-12-16 DIAGNOSIS — D6851 Activated protein C resistance: Secondary | ICD-10-CM

## 2023-12-16 DIAGNOSIS — N856 Intrauterine synechiae: Secondary | ICD-10-CM

## 2023-12-16 DIAGNOSIS — Z3A38 38 weeks gestation of pregnancy: Secondary | ICD-10-CM | POA: Diagnosis not present

## 2023-12-16 DIAGNOSIS — R7989 Other specified abnormal findings of blood chemistry: Secondary | ICD-10-CM

## 2023-12-16 DIAGNOSIS — O43199 Other malformation of placenta, unspecified trimester: Secondary | ICD-10-CM

## 2023-12-16 NOTE — Progress Notes (Signed)
   PRENATAL VISIT NOTE  Subjective:  Sandy Sullivan is a 38 y.o. G1P0000 at [redacted]w[redacted]d being seen today for ongoing prenatal care.  She is currently monitored for the following issues for this high-risk pregnancy and has History of ductal carcinoma in situ (DCIS) of breast; AMA (advanced maternal age) primigravida 53+; Hereditary hemochromatosis; Factor 5 Leiden mutation, heterozygous; Supervision of high risk pregnancy, antepartum; Low TSH level; Elevated ferritin level; UTI in pregnancy, antepartum, second trimester; Intrauterine synechiae; Placenta succenturiata, third trimester; and Marginal insertion of umbilical cord affecting management of mother on their problem list.  Patient reports doing well overall. Notes more lower abdominal and back pain. Also having sharp pain in cervix/vagina. +BH contractions. Had days of decreased fetal movement but notes fetus is active & vigorously moving the past few days.  Contractions: Not present. Vag. Bleeding: None.  Movement: Present. Denies leaking of fluid.   The following portions of the patient's history were reviewed and updated as appropriate: allergies, current medications, past family history, past medical history, past social history, past surgical history and problem list.   Objective:   Vitals:   12/16/23 1553  BP: 122/84  Pulse: 76  Weight: 180 lb 2 oz (81.7 kg)    Fetal Status: Fetal Heart Rate (bpm): 130   Movement: Present     General:  Alert, oriented and cooperative. Patient is in no acute distress.  Skin: Skin is warm and dry. No rash noted.   Cardiovascular: Normal heart rate noted  Respiratory: Normal respiratory effort, no problems with respiration noted  Abdomen: Soft, gravid, appropriate for gestational age.  Pain/Pressure: Present      Assessment and Plan:  Pregnancy: G1P0000 at [redacted]w[redacted]d 1. Supervision of high risk pregnancy, antepartum (Primary) 2. [redacted] weeks gestation of pregnancy - Discussed option for membrane sweep next  visit - Would consider pdIOL at 41 weeks, strongly recommend IOL at 42 weeks if no spontaneous labor. She is thinking about this as she would like to do water birth (saw [facility] 10/27) - Reviewed indications to present to MAU and strongly recommended eval for DFM   3. Factor 5 Leiden mutation, heterozygous pLov if mobility issues during/after pregnancy and for 6wk PP if CS  4. History of ductal carcinoma in situ (DCIS) of breast Followed by [facility]. S/p surgery and XRT with GNRH therapy (2020-2022)   5. Marginal insertion of umbilical cord affecting management of mother 6. Placenta succenturiata, third trimester 7. Intrauterine synechiae Post delivery US  to confirm complete removal of placenta Growth US  11/4 @ [redacted]w[redacted]d 3003g (39%)  8. Low TSH level  9. Hereditary hemochromatosis Followed by [facility], last visit 7/28 and had normal iron studies. No treatment.   10. Primigravida of advanced maternal age in third trimester  Please refer to After Visit Summary for other counseling recommendations.   Future Appointments  Date Time Provider Department Center  12/23/2023  3:50 PM Anyanwu, Gloris LABOR, MD [facility]  03/05/2024  3:00 PM [facility] None  03/09/2024  3:30 PM Babara Call, MD [facility] None   Kieth JAYSON Carolin, MD

## 2023-12-19 ENCOUNTER — Telehealth: Payer: Self-pay | Admitting: Obstetrics and Gynecology

## 2023-12-19 ENCOUNTER — Encounter: Payer: Self-pay | Admitting: Family Medicine

## 2023-12-19 NOTE — Telephone Encounter (Signed)
 Pt called in stating that she is concerned about her BP being abnormal she stated it was 126/92.

## 2023-12-20 ENCOUNTER — Encounter: Payer: Self-pay | Admitting: Obstetrics and Gynecology

## 2023-12-20 ENCOUNTER — Ambulatory Visit (INDEPENDENT_AMBULATORY_CARE_PROVIDER_SITE_OTHER): Admitting: Obstetrics and Gynecology

## 2023-12-20 VITALS — BP 130/85 | HR 82 | Wt 182.2 lb

## 2023-12-20 DIAGNOSIS — N856 Intrauterine synechiae: Secondary | ICD-10-CM | POA: Diagnosis not present

## 2023-12-20 DIAGNOSIS — O0990 Supervision of high risk pregnancy, unspecified, unspecified trimester: Secondary | ICD-10-CM

## 2023-12-20 DIAGNOSIS — D6851 Activated protein C resistance: Secondary | ICD-10-CM | POA: Diagnosis not present

## 2023-12-20 DIAGNOSIS — O09513 Supervision of elderly primigravida, third trimester: Secondary | ICD-10-CM

## 2023-12-20 DIAGNOSIS — O2342 Unspecified infection of urinary tract in pregnancy, second trimester: Secondary | ICD-10-CM | POA: Diagnosis not present

## 2023-12-20 DIAGNOSIS — O43193 Other malformation of placenta, third trimester: Secondary | ICD-10-CM

## 2023-12-20 DIAGNOSIS — Z3A38 38 weeks gestation of pregnancy: Secondary | ICD-10-CM

## 2023-12-20 DIAGNOSIS — Z86000 Personal history of in-situ neoplasm of breast: Secondary | ICD-10-CM

## 2023-12-20 DIAGNOSIS — O43199 Other malformation of placenta, unspecified trimester: Secondary | ICD-10-CM

## 2023-12-20 DIAGNOSIS — R7989 Other specified abnormal findings of blood chemistry: Secondary | ICD-10-CM

## 2023-12-20 LAB — POCT URINALYSIS DIPSTICK

## 2023-12-20 NOTE — Progress Notes (Signed)
 ROB: Pt added to Providers schedule as she states she feels she is having some leaking and is wanting to know if its amnionic fluid, also notes her hands and feet swelling and BP was 133/99 this morning at home

## 2023-12-20 NOTE — Progress Notes (Signed)
 PRENATAL VISIT NOTE  Add on visit for SROM evaluation  Subjective:  Sandy Sullivan is a 38 y.o. G1P0000 at [redacted]w[redacted]d being seen today for ongoing prenatal care.  She is currently monitored for the following issues for this high-risk pregnancy and has History of ductal carcinoma in situ (DCIS) of breast; AMA (advanced maternal age) primigravida 53+; Hereditary hemochromatosis; Factor 5 Leiden mutation, heterozygous; Supervision of high risk pregnancy, antepartum; Low TSH level; Elevated ferritin level; UTI in pregnancy, antepartum, second trimester; Intrauterine synechiae; Placenta succenturiata, third trimester; and Marginal insertion of umbilical cord affecting management of mother on their problem list.  Patient here for BP and UTI evaluation. Patient stated she had mild range BPs at home with hand and feet swelling. Also with vaginal odor. At RN evaluation, patient with watery vaginal discharge s/s, no itching or bleeding. GBS and GC/CT negative on 10/27 visit   . Vag. Bleeding: None.  Movement: Present. Denies leaking of fluid.   The following portions of the patient's history were reviewed and updated as appropriate: allergies, current medications, past family history, past medical history, past social history, past surgical history and problem list.   Objective:   Vitals:   12/20/23 1012  BP: 130/85  Pulse: 82  Weight: 182 lb 3.2 oz (82.6 kg)    Fetal Status:  Fetal Heart Rate (bpm): 140s Fundal Height: 38 cm Movement: Present Presentation: Vertex  General: Alert, oriented and cooperative. Patient is in no acute distress.  Skin: Skin is warm and dry. No rash noted.   Cardiovascular: Normal heart rate noted  Respiratory: Normal respiratory effort, no problems with respiration noted  Abdomen: Soft, gravid, appropriate for gestational age.        Pelvic: Cervical exam performed in the presence of a chaperone  Sterile speculum exam with white, watery normal vaginal discharge, cervix  visually closed.       Extremities: Normal range of motion.     Mental Status: Normal mood and affect. Normal behavior. Normal judgment and thought content.   Labs: nitrazine negative, fern slide negative UA dip +ketones     12/02/2023    3:43 PM 09/02/2023    3:39 PM 06/18/2023    2:39 PM  Depression screen PHQ 2/9  Decreased Interest 0 0 0  Down, Depressed, Hopeless 0 0 0  PHQ - 2 Score 0 0 0  Altered sleeping 1  0  Tired, decreased energy 2  1  Change in appetite 0  0  Feeling bad or failure about yourself  0  0  Trouble concentrating 0  0  Moving slowly or fidgety/restless 0  0  Suicidal thoughts 0  0  PHQ-9 Score 3   1   Difficult doing work/chores Not difficult at all  Not difficult at all     Data saved with a previous flowsheet row definition        12/02/2023    3:44 PM 06/18/2023    2:39 PM  GAD 7 : Generalized Anxiety Score  Nervous, Anxious, on Edge 0 0  Control/stop worrying 0 0  Worry too much - different things 0 0  Trouble relaxing 0 0  Restless 0 0  Easily annoyed or irritable 0 0  Afraid - awful might happen 0 0  Total GAD 7 Score 0 0  Anxiety Difficulty Not difficult at all Not difficult at all    Assessment and Plan:  Pregnancy: G1P0000 at [redacted]w[redacted]d 1. Supervision of high risk pregnancy, antepartum (Primary) Patient to bring  BP cuff from home and will check vs our machine here. D/w her and mom re: s/s to be on the look out for. TED hoses/compression stockings for trace LE edema and b/l hand edema.   Patient would like to go past EDC. MFM recommends weekly testing if goes past EDC - POCT Urinalysis Dipstick  2. UTI in pregnancy, antepartum, second trimester No e/o infection  3. Intrauterine synechiae Seen at anatomy u/s but subsequently not mentioned/seen on other ultrasounds 11/4: efw 39%, 3003g, ac 80%, afi 12, cephalic  4. Factor 5 Leiden mutation, heterozygous Anti-coagulation for any event that increases risk for VTE, such as c-section,  prolonged hospitalization, etc  5. Placenta succenturiata, third trimester Posterior. Attention at delivery. Consider ultrasound after delivery  6. Marginal insertion of umbilical cord affecting management of mother See above  7. Low TSH level Normal on 9/2  8. History of ductal carcinoma in situ (DCIS) of breast Followed by [facility]. S/p surgery and XRT with [facility] therapy (2020-2022) Lactation consult postpartum.   9. Hereditary hemochromatosis S/p Late July Heme consult and no concerns. Ferritin and Fe saturation actually low on 9/2 with Hgb 12.4  10. Elevated ferritin level See above  11. Primigravida of advanced maternal age in third trimester No issues  Term labor symptoms and general obstetric precautions including but not limited to vaginal bleeding, contractions, leaking of fluid and fetal movement were reviewed in detail with the patient. Please refer to After Visit Summary for other counseling recommendations.   Return in about 1 week (around 12/27/2023) for in person, md or app, low risk ob.  Future Appointments  Date Time Provider Department Center  12/23/2023  3:50 PM Anyanwu, Gloris LABOR, MD [facility]  03/05/2024  3:00 PM [facility] None  03/09/2024  3:30 PM Babara Call, MD [facility] None    Bebe Furry, MD  ADDENDUM: Per RN, BP machine in office and patient's give similar readings and still no HTN. ?due not having arm at correct angle at home. Will continue to follow  Bebe Furry Raddle MD Attending Center for [employer] (Faculty Practice) 12/20/2023 Time: 1130am

## 2023-12-20 NOTE — Progress Notes (Addendum)
 Pt came back to office with her BP cuff form home to check the readings against our machine.   Also asked about how she does check her BP at home and pt doe shave her arm and machine down below her heart, instructed that arm and machine must be heart level, so both should rest on a desk or counter with feet flat on the floor.   On our machine we got 123/82. On her machine it was 113/88

## 2023-12-23 ENCOUNTER — Encounter: Payer: Self-pay | Admitting: Obstetrics & Gynecology

## 2023-12-23 ENCOUNTER — Ambulatory Visit (INDEPENDENT_AMBULATORY_CARE_PROVIDER_SITE_OTHER): Admitting: Obstetrics & Gynecology

## 2023-12-23 VITALS — BP 117/81 | HR 83 | Wt 184.1 lb

## 2023-12-23 DIAGNOSIS — Z3A39 39 weeks gestation of pregnancy: Secondary | ICD-10-CM

## 2023-12-23 DIAGNOSIS — Z86000 Personal history of in-situ neoplasm of breast: Secondary | ICD-10-CM | POA: Diagnosis not present

## 2023-12-23 DIAGNOSIS — O0990 Supervision of high risk pregnancy, unspecified, unspecified trimester: Secondary | ICD-10-CM

## 2023-12-23 DIAGNOSIS — O43193 Other malformation of placenta, third trimester: Secondary | ICD-10-CM

## 2023-12-23 DIAGNOSIS — D6851 Activated protein C resistance: Secondary | ICD-10-CM

## 2023-12-23 DIAGNOSIS — O09513 Supervision of elderly primigravida, third trimester: Secondary | ICD-10-CM

## 2023-12-23 DIAGNOSIS — O43199 Other malformation of placenta, unspecified trimester: Secondary | ICD-10-CM | POA: Diagnosis not present

## 2023-12-23 NOTE — Progress Notes (Signed)
**Note Sandy-Identified via Obfuscation**  PRENATAL VISIT NOTE  Subjective:  Sandy Sullivan is a 38 y.o. G1P0000 at [redacted]w[redacted]d being seen today for ongoing prenatal care.  She is currently monitored for the following issues for this high-risk pregnancy and has History of ductal carcinoma in situ (DCIS) of breast; AMA (advanced maternal age) primigravida 6+; Hereditary hemochromatosis; Factor 5 Leiden mutation, heterozygous; Supervision of high risk pregnancy, antepartum; UTI in pregnancy, antepartum, second trimester; Intrauterine synechiae; Placenta succenturiata, third trimester; and Marginal insertion of umbilical cord affecting management of mother on their problem list.  Patient reports no complaints.  Contractions: Irregular. Vag. Bleeding: None.  Movement: Present. Denies leaking of fluid.   The following portions of the patient's history were reviewed and updated as appropriate: allergies, current medications, past family history, past medical history, past social history, past surgical history and problem list.   Objective:   Vitals:   12/23/23 1552  BP: 117/81  Pulse: 83  Weight: 184 lb 2 oz (83.5 kg)    Fetal Status:  Fetal Heart Rate (bpm): 152   Movement: Present    General: Alert, oriented and cooperative. Patient is in no acute distress.  Skin: Skin is warm and dry. No rash noted.   Cardiovascular: Normal heart rate noted  Respiratory: Normal respiratory effort, no problems with respiration noted  Abdomen: Soft, gravid, appropriate for gestational age.  Pain/Pressure: Absent     Pelvic: Cervical exam deferred        Extremities: Normal range of motion.     Mental Status: Normal mood and affect. Normal behavior. Normal judgment and thought content.      12/02/2023    3:43 PM 09/02/2023    3:39 PM 06/18/2023    2:39 PM  Depression screen PHQ 2/9  Decreased Interest 0 0 0  Down, Depressed, Hopeless 0 0 0  PHQ - 2 Score 0 0 0  Altered sleeping 1  0  Tired, decreased energy 2  1  Change in appetite 0  0   Feeling bad or failure about yourself  0  0  Trouble concentrating 0  0  Moving slowly or fidgety/restless 0  0  Suicidal thoughts 0  0  PHQ-9 Score 3   1   Difficult doing work/chores Not difficult at all  Not difficult at all     Data saved with a previous flowsheet row definition        12/02/2023    3:44 PM 06/18/2023    2:39 PM  GAD 7 : Generalized Anxiety Score  Nervous, Anxious, on Edge 0 0  Control/stop worrying 0 0  Worry too much - different things 0 0  Trouble relaxing 0 0  Restless 0 0  Easily annoyed or irritable 0 0  Afraid - awful might happen 0 0  Total GAD 7 Score 0 0  Anxiety Difficulty Not difficult at all Not difficult at all    US  MFM OB FOLLOW UP Result Date: 12/10/2023 ----------------------------------------------------------------------  OBSTETRICS REPORT                       (Signed Final 12/10/2023 04:23 pm) ---------------------------------------------------------------------- Patient Info  ID #:       [id number]                          D.O.B.:  1985/10/20 (38 yrs)(F)  Name:       Sandy Sullivan  Visit Date: 12/10/2023 12:13 pm ---------------------------------------------------------------------- Performed By  Attending:        Delora Smaller DO       Ref. Address:     [address]                                                             [address]  Performed By:     Jonette Nap        [facility]  Referred By:      [facility] ---------------------------------------------------------------------- Orders  #  Description                           Code        Ordered By  1  US  MFM OB FOLLOW UP                   23183.98    FREDIA FRESH  ----------------------------------------------------------------------  #  Order #                     Accession #                Episode #  1  [id number]                   [id number]                 [id number] ---------------------------------------------------------------------- Indications  Fetal and placental problem affecting          O36.90X0  management of mother, antepartum  Factor V Leiden mutation affecting             O99.119  pregnancy  Medical complication of pregnancy              O26.90  (Hereditary Hemochromatosis, history of  ductal carcinoma, s/p mastectomy)  Marginal insertion of umbilical cord affecting O43.193  management of mother in third trimester  Advanced maternal age primigravida 77+,        O68.513  third trimester (39)  Encounter for other antenatal screening        Z36.2  follow-up  [redacted] weeks  gestation of pregnancy                Z3A.37 ---------------------------------------------------------------------- Fetal Evaluation  Num Of Fetuses:         1  Fetal Heart Rate(bpm):  161  Cardiac Activity:       Observed  Presentation:           Cephalic  Placenta:               Posterior Succenturiate  P. Cord Insertion:      Marg insertion previously seen  Amniotic Fluid  AFI FV:      Within normal limits  AFI Sum(cm)     %Tile       Largest Pocket(cm)  12.29           42          3.63  RUQ(cm)       RLQ(cm)       LUQ(cm)        LLQ(cm)  3.63          2.85          3.1            2.71 ---------------------------------------------------------------------- Biometry  BPD:      88.5  mm     G. Age:  35w 5d         23  %    CI:         77.3   %    70 - 86                                                          FL/HC:      21.0   %    20.8 - 22.6  HC:      318.7  mm     G. Age:  35w 6d        4.6  %    HC/AC:      0.93        0.92 - 1.05  AC:      341.1  mm     G. Age:  38w 0d         80  %    FL/BPD:     75.7   %    71 - 87  FL:         67  mm     G. Age:  34w 3d        2.1  %    FL/AC:      19.6   %     20 - 24  HUM:      57.5  mm     G. Age:  33w 2d        < 5  %  LV:        4.3  mm  Est. FW:    3003  gm    6 lb 10 oz      39  % ---------------------------------------------------------------------- OB History  Gravidity:    1         Term:   0        Prem:   0        SAB:   0  TOP:  0       Ectopic:  0        Living: 0 ---------------------------------------------------------------------- Gestational Age  LMP:           37w 3d        Date:  03/23/23                  EDD:   12/28/23  U/S Today:     36w 0d                                        EDD:   01/07/24  Best:          37w 3d     Det. By:  LMP  (03/23/23)          EDD:   12/28/23 ---------------------------------------------------------------------- Anatomy  Cranium:               Previously seen        Aortic Arch:            Previously seen  Cavum:                 Previously seen        Ductal Arch:            Previously seen  Ventricles:            Appears normal         Diaphragm:              Appears normal  Choroid Plexus:        Previously seen        Stomach:                Appears normal, left                                                                        sided  Cerebellum:            Previously seen        Abdomen:                Previously seen  Posterior Fossa:       Previously seen        Abdominal Wall:         Previously seen  Face:                  Orbits and profile     Cord Vessels:           Previously seen                         previously seen  Lips:                  Previously seen        Kidneys:                Appear normal  Thoracic:              Previously seen        Bladder:  Appears normal  Heart:                 Previously seen        Spine:                  Previously seen  RVOT:                  Previously seen        Upper Extremities:      Previously seen  LVOT:                  Previously seen        Lower Extremities:      Previously seen  ---------------------------------------------------------------------- Comments  Sonographic findings  Single intrauterine pregnancy at 37w 3d.  Fetal cardiac activity:  Observed and appears normal.  Presentation: Cephalic.  Interval fetal anatomy appears normal.  Fetal biometry shows the estimated fetal weight at the 39  percentile.  Amniotic fluid volume: Within normal limits. MVP: 3.63 cm.  Placenta: Posterior Succenturiate.  There are limitations of prenatal ultrasound such as the  inability to detect certain abnormalities due to poor  visualization. Various factors such as fetal position,  gestational age and maternal body habitus may increase the  difficulty in visualizing the fetal anatomy.  Recommendations  - See Epic for Consult ----------------------------------------------------------------------                  Delora Smaller, DO Electronically Signed Final Report   12/10/2023 04:23 pm ----------------------------------------------------------------------    Assessment and Plan:  Pregnancy: G1P0000 at [redacted]w[redacted]d 1. Marginal insertion of umbilical cord affecting management of mother (Primary) EFW 39%, normal AFI.   2. Placenta succenturiata, third trimester Post delivery ultrasound recommended to assure no retained placental lobe.   3. History of ductal carcinoma in situ (DCIS) of breast Followed by [facility]. S/p surgery and XRT with GNRH therapy (2020-2022). Lactation consult postpartum.    4. Factor 5 Leiden mutation, heterozygous Prophylatic lovenox recommended during pregnancy if any issues with mobility and same for six weeks postpartum or if has cesarean section.   5. Hereditary hemochromatosis Followed by [facility], last visit 7/28 and had normal iron studies. No treatment.   6. Primigravida of advanced maternal age in third trimester 7. [redacted] weeks gestation of pregnancy 8. Supervision of high risk pregnancy, antepartum Multiple questions answered. Patient is unwilling  to schedule postdates IOL for now but agrees to post dates testing next week.  Discussed concern about aging placenta, increased risk of stillbirth in the post dates period.  She will think about this and let us  know next visit about when she is willing to consider induction.  Labor symptoms and general obstetric precautions including but not limited to vaginal bleeding, contractions, leaking of fluid and fetal movement were reviewed in detail with the patient. Please refer to After Visit Summary for other counseling recommendations.   Return in about 1 week (around 12/30/2023) for NST, BPP, OFFICE OB VISIT (MD only).  Future Appointments  Date Time Provider Department Center  03/05/2024  3:00 PM [facility] None  03/09/2024  3:30 PM Babara Call, MD [facility] None    Gloris Hugger, MD

## 2023-12-23 NOTE — Patient Instructions (Addendum)
 Return to office for any scheduled appointments. Call the office or go to the MAU at [facility] at [facility] if: You begin to have strong, frequent contractions Your water breaks.  Sometimes it is a big gush of fluid, sometimes it is just a trickle that keeps getting your underwear wet or running down your legs You have vaginal bleeding.  It is normal to have a small amount of spotting if your cervix was checked.  You do not feel your baby moving like normal.  If you do not, get something to eat and drink and lay down and focus on feeling your baby move.   If your baby is still not moving like normal, you should call the office or go to MAU. Any other obstetric concerns.     Things to Try After 37 weeks for Cervical Ripening (to get your cervix ready for labor) : May try one or all in addition to walking, nipple intercourse and nipple stimulation:   Try the [employer] at [email or web address].[email or web address] daily to improve baby's position and encourage the onset of labor.  Cervical Ripening: May try one or both Red Raspberry Leaf capsules or tea:  two 300mg  or 400mg  tablets with each meal, 2-3 times a day, or 1-3 cups of tea daily  Potential Side Effects Of Raspberry Leaf:  Most women do not experience any side effects from drinking raspberry leaf tea. However, nausea and loose stools are possible   Evening Primrose Oil capsules: take 1 capsule by mouth and place one capsule in the vagina every night.    Some of the potential side effects:  Upset stomach  Loose stools or diarrhea  Headaches  Nausea  3.  6 Dates a day (may taste better if warmed in microwave until soft). Found where raisins are in the grocery store

## 2023-12-24 ENCOUNTER — Encounter: Payer: Self-pay | Admitting: Family Medicine

## 2023-12-30 ENCOUNTER — Ambulatory Visit (INDEPENDENT_AMBULATORY_CARE_PROVIDER_SITE_OTHER): Admitting: Obstetrics and Gynecology

## 2023-12-30 ENCOUNTER — Ambulatory Visit: Admitting: *Deleted

## 2023-12-30 ENCOUNTER — Ambulatory Visit: Payer: Self-pay

## 2023-12-30 VITALS — BP 129/80 | HR 80 | Wt 184.0 lb

## 2023-12-30 DIAGNOSIS — Z3A40 40 weeks gestation of pregnancy: Secondary | ICD-10-CM

## 2023-12-30 DIAGNOSIS — Z86000 Personal history of in-situ neoplasm of breast: Secondary | ICD-10-CM

## 2023-12-30 DIAGNOSIS — O09513 Supervision of elderly primigravida, third trimester: Secondary | ICD-10-CM

## 2023-12-30 DIAGNOSIS — O43199 Other malformation of placenta, unspecified trimester: Secondary | ICD-10-CM

## 2023-12-30 DIAGNOSIS — O2343 Unspecified infection of urinary tract in pregnancy, third trimester: Secondary | ICD-10-CM

## 2023-12-30 DIAGNOSIS — O0990 Supervision of high risk pregnancy, unspecified, unspecified trimester: Secondary | ICD-10-CM

## 2023-12-30 DIAGNOSIS — O43193 Other malformation of placenta, third trimester: Secondary | ICD-10-CM

## 2023-12-30 DIAGNOSIS — O480 Post-term pregnancy: Secondary | ICD-10-CM

## 2023-12-30 DIAGNOSIS — O2342 Unspecified infection of urinary tract in pregnancy, second trimester: Secondary | ICD-10-CM

## 2023-12-30 DIAGNOSIS — D6851 Activated protein C resistance: Secondary | ICD-10-CM

## 2023-12-30 DIAGNOSIS — N856 Intrauterine synechiae: Secondary | ICD-10-CM

## 2023-12-30 NOTE — Progress Notes (Signed)

## 2023-12-31 ENCOUNTER — Other Ambulatory Visit: Payer: Self-pay | Admitting: Obstetrics and Gynecology

## 2023-12-31 ENCOUNTER — Encounter (HOSPITAL_COMMUNITY): Payer: Self-pay | Admitting: *Deleted

## 2023-12-31 ENCOUNTER — Telehealth (HOSPITAL_COMMUNITY): Payer: Self-pay | Admitting: *Deleted

## 2023-12-31 NOTE — Telephone Encounter (Signed)
 Preadmission screen

## 2023-12-31 NOTE — Progress Notes (Signed)
 PRENATAL VISIT NOTE  Subjective:  Sandy Sullivan is a 38 y.o. G1P0000 at 100w2d being seen today for ongoing prenatal care.  She is currently monitored for the following issues for this high-risk pregnancy and has History of ductal carcinoma in situ (DCIS) of breast; AMA (advanced maternal age) primigravida 11+; Hereditary hemochromatosis; Factor 5 Leiden mutation, heterozygous; Supervision of high risk pregnancy, antepartum; UTI in pregnancy, antepartum, second trimester; Intrauterine synechiae; Placenta succenturiata, third trimester; and Marginal insertion of umbilical cord affecting management of mother on their problem list.   Contractions: Irritability. Vag. Bleeding: None.  Movement: Present. Denies leaking of fluid.   The following portions of the patient's history were reviewed and updated as appropriate: allergies, current medications, past family history, past medical history, past social history, past surgical history and problem list.   Objective:   Vitals:   12/30/23 1454  BP: 129/80  Pulse: 80  Weight: 184 lb (83.5 kg)    Fetal Status:  Fetal Heart Rate (bpm): RNST   Movement: Present Presentation: Vertex  General: Alert, oriented and cooperative. Patient is in no acute distress.  Skin: Skin is warm and dry. No rash noted.   Cardiovascular: Normal heart rate noted  Respiratory: Normal respiratory effort, no problems with respiration noted  Abdomen: Soft, gravid, appropriate for gestational age.  Pain/Pressure: Present     Pelvic: Cervical exam performed in the presence of a chaperone  FT and patient stretched to 1.5 with membrane sweeping Effacement (%): 50 Station: Ballotable  Extremities: Normal range of motion.  Edema: Moderate pitting, indentation subsides rapidly  Mental Status: Normal mood and affect. Normal behavior. Normal judgment and thought content.       12/02/2023    3:43 PM 09/02/2023    3:39 PM 06/18/2023    2:39 PM  Depression screen PHQ 2/9   Decreased Interest 0 0 0  Down, Depressed, Hopeless 0 0 0  PHQ - 2 Score 0 0 0  Altered sleeping 1  0  Tired, decreased energy 2  1  Change in appetite 0  0  Feeling bad or failure about yourself  0  0  Trouble concentrating 0  0  Moving slowly or fidgety/restless 0  0  Suicidal thoughts 0  0  PHQ-9 Score 3   1   Difficult doing work/chores Not difficult at all  Not difficult at all     Data saved with a previous flowsheet row definition        12/02/2023    3:44 PM 06/18/2023    2:39 PM  GAD 7 : Generalized Anxiety Score  Nervous, Anxious, on Edge 0 0  Control/stop worrying 0 0  Worry too much - different things 0 0  Trouble relaxing 0 0  Restless 0 0  Easily annoyed or irritable 0 0  Afraid - awful might happen 0 0  Total GAD 7 Score 0 0  Anxiety Difficulty Not difficult at all Not difficult at all    Assessment and Plan:  Pregnancy: G1P0000 at [redacted]w[redacted]d 1. Supervision of high risk pregnancy, antepartum (Primary) Patient amenable to membrane sweeping, which was done today Long d/w her and her partner re: rationale for post dates IOL, inherent interventions associated with an IOL, that it may take some time.  12/3 AM IOL. Bpp 10/10, afi 9.55, cephalic  RTC 1wk for ROB and post dates testing 2. UTI in pregnancy, antepartum, second trimester No e/o infection. July Ucx negative  3. Intrauterine synechiae Seen at anatomy u/s but  subsequently not mentioned/seen on other ultrasounds 11/4: efw 39%, 3003g, ac 80%, afi 12, cephalic  4. Factor 5 Leiden mutation, heterozygous Anti-coagulation for any event that increases risk for VTE, such as c-section, prolonged hospitalization, etc  5. Placenta succenturiata, third trimester Posterior. Attention at delivery. Consider ultrasound after delivery  6. Marginal insertion of umbilical cord affecting management of mother See above  7. Low TSH level Normal on 9/2  8. History of ductal carcinoma in situ (DCIS) of  breast Followed by [facility]. S/p surgery and XRT with [facility] therapy (2020-2022) Lactation consult postpartum.   9. Hereditary hemochromatosis S/p Late July Heme consult and no concerns. Ferritin and Fe saturation actually low on 9/2 with Hgb 12.4  10. Elevated ferritin level See above  11. Primigravida of advanced maternal age in third trimester No issues  Term labor symptoms and general obstetric precautions including but not limited to vaginal bleeding, contractions, leaking of fluid and fetal movement were reviewed in detail with the patient. Please refer to After Visit Summary for other counseling recommendations.   No follow-ups on file.  Future Appointments  Date Time Provider Department Center  01/06/2024  1:10 PM [facility] NST [facility]  01/08/2024  6:45 AM [facility] SCHED ROOM [facility] None  03/05/2024  3:00 PM [facility] LAB [facility] None  03/09/2024  3:30 PM Babara Call, MD [facility] None    Bebe Furry, MD  ADDENDUM: Per RN, BP machine in office and patient's give similar readings and still no HTN. ?due not having arm at correct angle at home. Will continue to follow  Bebe Furry Raddle MD Attending Center for [employer] (Faculty Practice) 12/31/2023 Time: 1130am

## 2024-01-06 ENCOUNTER — Ambulatory Visit: Admitting: *Deleted

## 2024-01-06 ENCOUNTER — Ambulatory Visit: Admitting: Obstetrics & Gynecology

## 2024-01-06 ENCOUNTER — Ambulatory Visit: Payer: Self-pay

## 2024-01-06 VITALS — BP 113/78 | HR 92 | Wt 186.0 lb

## 2024-01-06 DIAGNOSIS — D6851 Activated protein C resistance: Secondary | ICD-10-CM | POA: Diagnosis not present

## 2024-01-06 DIAGNOSIS — O43199 Other malformation of placenta, unspecified trimester: Secondary | ICD-10-CM

## 2024-01-06 DIAGNOSIS — O0990 Supervision of high risk pregnancy, unspecified, unspecified trimester: Secondary | ICD-10-CM

## 2024-01-06 DIAGNOSIS — Z3A41 41 weeks gestation of pregnancy: Secondary | ICD-10-CM

## 2024-01-06 DIAGNOSIS — O43193 Other malformation of placenta, third trimester: Secondary | ICD-10-CM | POA: Diagnosis not present

## 2024-01-06 DIAGNOSIS — O0993 Supervision of high risk pregnancy, unspecified, third trimester: Secondary | ICD-10-CM | POA: Diagnosis not present

## 2024-01-06 DIAGNOSIS — O480 Post-term pregnancy: Secondary | ICD-10-CM

## 2024-01-06 DIAGNOSIS — Z86000 Personal history of in-situ neoplasm of breast: Secondary | ICD-10-CM | POA: Diagnosis not present

## 2024-01-06 DIAGNOSIS — O09513 Supervision of elderly primigravida, third trimester: Secondary | ICD-10-CM

## 2024-01-06 NOTE — Patient Instructions (Signed)

## 2024-01-06 NOTE — Progress Notes (Signed)

## 2024-01-06 NOTE — Progress Notes (Signed)
 PRENATAL VISIT NOTE  Subjective:  Sandy Sullivan is a 38 y.o. G1P0000 at [redacted]w[redacted]d being seen today for ongoing prenatal care.  She is currently monitored for the following issues for this high-risk pregnancy and has History of ductal carcinoma in situ (DCIS) of breast; AMA (advanced maternal age) primigravida 34+; Hereditary hemochromatosis; Factor 5 Leiden mutation, heterozygous; Supervision of high risk pregnancy, antepartum; UTI in pregnancy, antepartum, second trimester; Intrauterine synechiae; Placenta succenturiata, third trimester; and Marginal insertion of umbilical cord affecting management of mother on their problem list.  Patient reports no complaints.  Here with husband. Contractions: Irritability. Vag. Bleeding: None.  Movement: Present. Denies leaking of fluid.   The following portions of the patient's history were reviewed and updated as appropriate: allergies, current medications, past family history, past medical history, past social history, past surgical history and problem list.   Objective:   Vitals:   01/06/24 1354  BP: 113/78  Pulse: 92  Weight: 186 lb (84.4 kg)    Fetal Status:  Fetal Heart Rate (bpm): NST   Movement: Present    General: Alert, oriented and cooperative. Patient is in no acute distress.  Skin: Skin is warm and dry. No rash noted.   Cardiovascular: Normal heart rate noted  Respiratory: Normal respiratory effort, no problems with respiration noted  Abdomen: Soft, gravid, appropriate for gestational age.  Pain/Pressure: Absent     Pelvic: Cervical exam deferred        Extremities: Normal range of motion.     Mental Status: Normal mood and affect. Normal behavior. Normal judgment and thought content.      12/02/2023    3:43 PM 09/02/2023    3:39 PM 06/18/2023    2:39 PM  Depression screen PHQ 2/9  Decreased Interest 0 0 0  Down, Depressed, Hopeless 0 0 0  PHQ - 2 Score 0 0 0  Altered sleeping 1  0  Tired, decreased energy 2  1  Change in  appetite 0  0  Feeling bad or failure about yourself  0  0  Trouble concentrating 0  0  Moving slowly or fidgety/restless 0  0  Suicidal thoughts 0  0  PHQ-9 Score 3   1   Difficult doing work/chores Not difficult at all  Not difficult at all     Data saved with a previous flowsheet row definition        12/02/2023    3:44 PM 06/18/2023    2:39 PM  GAD 7 : Generalized Anxiety Score  Nervous, Anxious, on Edge 0 0  Control/stop worrying 0 0  Worry too much - different things 0 0  Trouble relaxing 0 0  Restless 0 0  Easily annoyed or irritable 0 0  Afraid - awful might happen 0 0  Total GAD 7 Score 0 0  Anxiety Difficulty Not difficult at all Not difficult at all    US  FETAL BPP W/NONSTRESS Result Date: 01/06/2024 ----------------------------------------------------------------------  OBSTETRICS REPORT                       (Signed Final 01/06/2024 07:10 am) ---------------------------------------------------------------------- Patient Info  ID #:       [id number]                          D.O.B.:  04/29/1985 (38 yrs)(F)  Name:       Sandy Sullivan  Visit Date: 12/30/2023 03:09 pm ---------------------------------------------------------------------- Performed By  Attending:        Bebe Furry MD     Ref. Address:     [address]                                                             [address]  Performed By:     Wanda Buckles RN     Location:         [facility] for                                                             [facility]  Referred By:      [facility] Correne Beagle ---------------------------------------------------------------------- Orders  #  Description                           Code        Ordered By  1  US  FETAL BPP W/NONSTRESS              23181.5     BEBE FURRY  ----------------------------------------------------------------------  #  Order #                     Accession #                Episode #  1  [id number]                   [id number]                 [id number] ---------------------------------------------------------------------- Indications  [redacted] weeks gestation of pregnancy                Z3A.40 ---------------------------------------------------------------------- Fetal Evaluation  Num Of Fetuses:         1  Cardiac Activity:       Observed  Presentation:           Cephalic  Amniotic Fluid  AFI FV:      Within normal limits  AFI Sum(cm)     %Tile       Largest Pocket(cm)  9.55            27          4.46  RUQ(cm)       RLQ(cm)  LUQ(cm)        LLQ(cm)  3.61          4.46          1.48           0 ---------------------------------------------------------------------- Biophysical Evaluation  Amniotic F.V:   Within normal limits       F. Tone:        Observed  F. Movement:    Observed                   N.S.T:          Reactive  F. Breathing:   Observed                   Score:          10/10 ---------------------------------------------------------------------- OB History  Gravidity:    1         Term:   0        Prem:   0        SAB:   0  TOP:          0       Ectopic:  0        Living: 0 ---------------------------------------------------------------------- Gestational Age  LMP:           40w 2d        Date:  03/23/23                  EDD:   12/28/23  Best:          40w 2d     Det. By:  LMP  (03/23/23)          EDD:   12/28/23 ---------------------------------------------------------------------- Impression  Reassuring ultrasound ---------------------------------------------------------------------- Recommendations  Continue weekly testing until delivered ----------------------------------------------------------------------                 Bebe Furry, MD Electronically Signed Final Report   01/06/2024 07:10 am  ----------------------------------------------------------------------   US  MFM OB FOLLOW UP Result Date: 12/10/2023 ----------------------------------------------------------------------  OBSTETRICS REPORT                       (Signed Final 12/10/2023 04:23 pm) ---------------------------------------------------------------------- Patient Info  ID #:       [id number]                          D.O.B.:  06/29/1985 (38 yrs)(F)  Name:       [facility]                Visit Date: 12/10/2023 12:13 pm ---------------------------------------------------------------------- Performed By  Attending:        Delora Smaller DO       Ref. Address:     [address]                                                             [address]  Performed By:     Jonette Nap        Location[facility]         [facility]  Referred By:      [facility] ---------------------------------------------------------------------- Orders  #  Description                           Code        Ordered By  1  US  MFM OB FOLLOW UP                   23183.98    FREDIA FRESH ----------------------------------------------------------------------  #  Order #                     Accession #                Episode #  1  [id number]                   [id number]                 [id number] ---------------------------------------------------------------------- Indications  Fetal and placental problem affecting          O36.90X0  management of mother, antepartum  Factor V Leiden mutation affecting             O99.119  pregnancy  Medical complication of pregnancy              O26.90  (Hereditary Hemochromatosis, history of  ductal carcinoma, s/p mastectomy)  Marginal insertion of umbilical cord affecting O43.193  management of mother in third trimester   Advanced maternal age primigravida 31+,        O15.513  third trimester (72)  Encounter for other antenatal screening        Z36.2  follow-up  [redacted] weeks gestation of pregnancy                Z3A.37 ---------------------------------------------------------------------- Fetal Evaluation  Num Of Fetuses:         1  Fetal Heart Rate(bpm):  161  Cardiac Activity:       Observed  Presentation:           Cephalic  Placenta:               Posterior Succenturiate  P. Cord Insertion:      Marg insertion previously seen  Amniotic Fluid  AFI FV:      Within normal limits  AFI Sum(cm)     %Tile       Largest Pocket(cm)  12.29           42          3.63  RUQ(cm)       RLQ(cm)       LUQ(cm)        LLQ(cm)  3.63  2.85          3.1            2.71 ---------------------------------------------------------------------- Biometry  BPD:      88.5  mm     G. Age:  35w 5d         23  %    CI:         77.3   %    70 - 86                                                          FL/HC:      21.0   %    20.8 - 22.6  HC:      318.7  mm     G. Age:  35w 6d        4.6  %    HC/AC:      0.93        0.92 - 1.05  AC:      341.1  mm     G. Age:  38w 0d         80  %    FL/BPD:     75.7   %    71 - 87  FL:         67  mm     G. Age:  34w 3d        2.1  %    FL/AC:      19.6   %    20 - 24  HUM:      57.5  mm     G. Age:  33w 2d        < 5  %  LV:        4.3  mm  Est. FW:    3003  gm    6 lb 10 oz      39  % ---------------------------------------------------------------------- OB History  Gravidity:    1         Term:   0        Prem:   0        SAB:   0  TOP:          0       Ectopic:  0        Living: 0 ---------------------------------------------------------------------- Gestational Age  LMP:           37w 3d        Date:  03/23/23                  EDD:   12/28/23  U/S Today:     36w 0d                                        EDD:   01/07/24  Best:          37w 3d     Det. By:  LMP  (03/23/23)          EDD:   12/28/23  ---------------------------------------------------------------------- Anatomy  Cranium:               Previously seen  Aortic Arch:            Previously seen  Cavum:                 Previously seen        Ductal Arch:            Previously seen  Ventricles:            Appears normal         Diaphragm:              Appears normal  Choroid Plexus:        Previously seen        Stomach:                Appears normal, left                                                                        sided  Cerebellum:            Previously seen        Abdomen:                Previously seen  Posterior Fossa:       Previously seen        Abdominal Wall:         Previously seen  Face:                  Orbits and profile     Cord Vessels:           Previously seen                         previously seen  Lips:                  Previously seen        Kidneys:                Appear normal  Thoracic:              Previously seen        Bladder:                Appears normal  Heart:                 Previously seen        Spine:                  Previously seen  RVOT:                  Previously seen        Upper Extremities:      Previously seen  LVOT:                  Previously seen        Lower Extremities:      Previously seen ---------------------------------------------------------------------- Comments  Sonographic findings  Single intrauterine pregnancy at 37w 3d.  Fetal cardiac activity:  Observed and appears normal.  Presentation: Cephalic.  Interval fetal anatomy appears normal.  Fetal biometry shows the estimated fetal weight at the 39  percentile.  Amniotic fluid volume: Within normal  limits. MVP: 3.63 cm.  Placenta: Posterior Succenturiate.  There are limitations of prenatal ultrasound such as the  inability to detect certain abnormalities due to poor  visualization. Various factors such as fetal position,  gestational age and maternal body habitus may increase the  difficulty in visualizing the fetal anatomy.   Recommendations  - See Epic for Consult ----------------------------------------------------------------------                  Delora Smaller, DO Electronically Signed Final Report   12/10/2023 04:23 pm ----------------------------------------------------------------------    Assessment and Plan:  Pregnancy: G1P0000 at [redacted]w[redacted]d 1. Marginal insertion of umbilical cord affecting management of mother (Primary) EFW 39%, normal AFI.   2. Placenta succenturiata, third trimester Post delivery ultrasound recommended to assure no retained placental lobe.   3. History of ductal carcinoma in situ (DCIS) of breast Followed by [facility]. S/p surgery and XRT with [facility] therapy (2020-2022). Lactation consult postpartum.    4. Factor 5 Leiden mutation, heterozygous Prophylatic lovenox recommended during pregnancy if any issues with mobility and same for six weeks postpartum or if has cesarean section.   5. Hereditary hemochromatosis Followed by [facility], last visit 7/28 and had normal iron studies. No treatment.   6. Primigravida of advanced maternal age in third trimester 7. [redacted] weeks gestation of pregnancy 8. Supervision of high risk pregnancy, antepartum NST performed today was reviewed and was found to be reactive. Subsequent BPP performed today was also reviewed and was found to be 10/10. AFI was also normal. Cephalic presentation. Multiple questions answered about scheduled induction on 01/08/24 morning; details of induction discussed in detail.  Labor symptoms and general obstetric precautions including but not limited to vaginal bleeding, contractions, leaking of fluid and fetal movement were reviewed in detail with the patient. Please refer to After Visit Summary for other counseling recommendations.   Return for Postpartum check.  Future Appointments  Date Time Provider Department Center  01/08/2024  6:45 AM [facility] SCHED ROOM [facility] None  03/05/2024  3:00 PM [facility]  None  03/09/2024  3:30 PM Babara Call, MD [facility] None    Gloris Hugger, MD

## 2024-01-08 ENCOUNTER — Other Ambulatory Visit: Payer: Self-pay

## 2024-01-08 ENCOUNTER — Inpatient Hospital Stay (HOSPITAL_COMMUNITY)

## 2024-01-08 ENCOUNTER — Encounter (HOSPITAL_COMMUNITY): Payer: Self-pay | Admitting: Obstetrics and Gynecology

## 2024-01-08 ENCOUNTER — Inpatient Hospital Stay (HOSPITAL_COMMUNITY)
Admission: AD | Admit: 2024-01-08 | Discharge: 2024-01-13 | DRG: 787 | Disposition: A | Attending: Obstetrics & Gynecology | Admitting: Obstetrics & Gynecology

## 2024-01-08 DIAGNOSIS — Z8759 Personal history of other complications of pregnancy, childbirth and the puerperium: Secondary | ICD-10-CM | POA: Diagnosis present

## 2024-01-08 DIAGNOSIS — O43193 Other malformation of placenta, third trimester: Secondary | ICD-10-CM | POA: Diagnosis present

## 2024-01-08 DIAGNOSIS — O480 Post-term pregnancy: Secondary | ICD-10-CM | POA: Diagnosis not present

## 2024-01-08 DIAGNOSIS — N856 Intrauterine synechiae: Secondary | ICD-10-CM | POA: Diagnosis present

## 2024-01-08 DIAGNOSIS — Z86000 Personal history of in-situ neoplasm of breast: Secondary | ICD-10-CM | POA: Diagnosis present

## 2024-01-08 DIAGNOSIS — O4423 Partial placenta previa NOS or without hemorrhage, third trimester: Secondary | ICD-10-CM | POA: Diagnosis not present

## 2024-01-08 DIAGNOSIS — O43199 Other malformation of placenta, unspecified trimester: Secondary | ICD-10-CM | POA: Diagnosis present

## 2024-01-08 DIAGNOSIS — O09519 Supervision of elderly primigravida, unspecified trimester: Secondary | ICD-10-CM

## 2024-01-08 DIAGNOSIS — O1490 Unspecified pre-eclampsia, unspecified trimester: Secondary | ICD-10-CM | POA: Diagnosis present

## 2024-01-08 DIAGNOSIS — D6851 Activated protein C resistance: Secondary | ICD-10-CM | POA: Diagnosis present

## 2024-01-08 DIAGNOSIS — O09523 Supervision of elderly multigravida, third trimester: Secondary | ICD-10-CM | POA: Diagnosis not present

## 2024-01-08 LAB — CBC
HCT: 33 % — ABNORMAL LOW (ref 36.0–46.0)
Hemoglobin: 11.4 g/dL — ABNORMAL LOW (ref 12.0–15.0)
MCH: 28.9 pg (ref 26.0–34.0)
MCHC: 34.5 g/dL (ref 30.0–36.0)
MCV: 83.8 fL (ref 80.0–100.0)
Platelets: 209 K/uL (ref 150–400)
RBC: 3.94 MIL/uL (ref 3.87–5.11)
RDW: 14.4 % (ref 11.5–15.5)
WBC: 7.4 K/uL (ref 4.0–10.5)
nRBC: 0 % (ref 0.0–0.2)

## 2024-01-08 LAB — TYPE AND SCREEN
ABO/RH(D): A POS
Antibody Screen: NEGATIVE

## 2024-01-08 MED ORDER — SOD CITRATE-CITRIC ACID 500-334 MG/5ML PO SOLN
30.0000 mL | ORAL | Status: DC | PRN
  Filled 2024-01-08: qty 30

## 2024-01-08 MED ORDER — TERBUTALINE SULFATE 1 MG/ML IJ SOLN: 0.2500 mg | Freq: Once | INTRAMUSCULAR | Status: DC | PRN

## 2024-01-08 MED ORDER — OXYTOCIN-SODIUM CHLORIDE 30-0.9 UT/500ML-% IV SOLN: 1.0000 m[IU]/min | INTRAVENOUS | Status: DC

## 2024-01-08 MED ORDER — LIDOCAINE HCL (PF) 1 % IJ SOLN: 30.0000 mL | INTRAMUSCULAR | Status: DC | PRN

## 2024-01-08 MED ORDER — OXYTOCIN BOLUS FROM INFUSION: 333.0000 mL | Freq: Once | INTRAVENOUS | Status: DC

## 2024-01-08 MED ORDER — LACTATED RINGERS IV SOLN: INTRAVENOUS | Status: AC

## 2024-01-08 MED ORDER — ONDANSETRON HCL 4 MG/2ML IJ SOLN: 4.0000 mg | Freq: Four times a day (QID) | INTRAMUSCULAR | Status: DC | PRN

## 2024-01-08 MED ORDER — OXYTOCIN-SODIUM CHLORIDE 30-0.9 UT/500ML-% IV SOLN: 2.5000 [IU]/h | INTRAVENOUS | Status: DC

## 2024-01-08 MED ORDER — MISOPROSTOL 25 MCG QUARTER TABLET
25.0000 ug | ORAL_TABLET | Freq: Once | ORAL | Status: AC
  Administered 2024-01-08: 25 ug via VAGINAL
  Filled 2024-01-08: qty 1

## 2024-01-08 MED ORDER — ACETAMINOPHEN 325 MG PO TABS: 650.0000 mg | ORAL_TABLET | ORAL | Status: DC | PRN

## 2024-01-08 MED ORDER — LACTATED RINGERS IV SOLN
500.0000 mL | INTRAVENOUS | Status: AC | PRN
  Administered 2024-01-08: 500 mL via INTRAVENOUS

## 2024-01-08 MED ORDER — MISOPROSTOL 50MCG HALF TABLET
50.0000 ug | ORAL_TABLET | Freq: Once | ORAL | Status: AC
  Administered 2024-01-08: 50 ug via ORAL
  Filled 2024-01-08: qty 1

## 2024-01-08 NOTE — Progress Notes (Signed)
 Patient ID: Sandy Sullivan, female   DOB: 1985/10/27, 38 y.o.   MRN: [id number]  S/p dual dose of cytotec; feeling some mild cramping  Recent BP 132/91 FHR 150s, +accels, no decels Ctx irreg/mild Cx deferred (was 1/70/-2 vtx @ time of cyto)  IUP@41 .4wks IOL process Unfavorable cx  - Lengthy discussion regarding induction and the various options; leaning towards cervical foley placement and a 2nd dose of cytotec at 2300. Wants to keep waterbirth option on the table but understands it isn't possible with Pitocin; will continue to revisit - Continue to monitor BPs - Plan for prophylactic Lovenox after delivery  Suzen JONETTA Gentry CNM 01/08/2024 9:07 PM

## 2024-01-08 NOTE — H&P (Signed)
 Sandy Sullivan is a 38 y.o. female G1P0000 presenting for IOL for Postdates at [redacted]w[redacted]d. H/o Lt Breast cancer s/p b/l mastectomy & XRT (followed by [facility]). AMA. 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 OB History     Gravida  1   Para  0   Term  0   Preterm  0   AB  0   Living  0      SAB  0   IAB  0   Ectopic  0   Multiple  0   Live Births  0        Obstetric Comments  1st Menstrual Cycle:  14           Past Medical History:  Diagnosis Date   Allergy    Asthma    exercised inducted   Bloody discharge from left nipple 09/17/2017   BRCA negative 09/25/2017   [facility]   Breast cancer (HCC) 2019   Left Breast. s/p b/l mastectomies, XRT. followed by [facility]   Breast lump on left side at 12 o'clock position 09/17/2017   Elevated ferritin level 06/25/2023   [ ]  f/u mfm recs  [ ]  recheck with GTT?     Factor 5 Leiden mutation, heterozygous    History of PCOS 09/17/2017   Low TSH level 06/25/2023   [ ]  repeat with 28wk GTT     PCOS (polycystic ovarian syndrome)    Past Surgical History:  Procedure Laterality Date   BREAST BIOPSY Left 09/19/2017   DUCTAL CARCINOMA IN SITU WITH CALCIFICATIONS   BREAST RECONSTRUCTION     COMPLETE MASTECTOMY W/ SENTINEL NODE BIOPSY  2019   INGUINAL HERNIA REPAIR Bilateral    child   Family History: family history includes Autoimmune disease in her father and mother; Breast cancer in her maternal aunt; Factor V Leiden deficiency in her mother; Heart attack in her father. Social History:  reports that she has never smoked. She has never used smokeless tobacco. She reports that she does not drink alcohol and does not use drugs.      NURSING  PROVIDER  Office Location [facility] Dating by [employer] u/s  [facility] Model Traditional Anatomy U/S WNL, female  Initiated care at  wks                 Language  English               LAB RESULTS   Support  Person   Genetics NIPS: declined AFP: declined      NT/IT (FT only)        Carrier Screen Horizon:   Rhogam  A/Positive/-- (05/13 1538) A1C/GTT Early HgbA1C: neg Third trimester 2 hr GTT:   Flu Vaccine Declined 11/20/23      TDaP Vaccine Declined 10/08/23 Blood Type A/Positive/-- (05/13 1538)A+  RSV Vaccine   Antibody Negative (05/13 1538)neg  COVID Vaccine   Rubella 11.90 (05/13 1538)imm  Feeding Plan Formula-- history of bilateral masectomy RPR Non Reactive (05/13 1538)neg  Contraception   HBsAg Negative (05/13 1538)neg  Circumcision   HIV Non Reactive (05/13 1538)neg  Pediatrician    HCVAb Non Reactive (05/13 1538)neg  Prenatal Classes        BTL Consent   Pap       Diagnosis  Date Value Ref Range Status  10/28/2019     Final    - Negative for intraepithelial lesion or malignancy (  NILM)    BTL Pre-payment   GC/CT Initial:  neg 36wks:    VBAC Consent   GBS For PCN allergy, check sensitivities   BRx Optimized? 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 Cord Insertion:      Marg insertion  previously seen    Amniotic Fluid  AFI FV:      Within normal limits    AFI Sum(cm)     %Tile       Largest Pocket(cm)  12.29           42          3.63     Est. FW:    3003  gm    6 lb 10 oz      39  % ----------------------------------------------------------------------    Review of Systems  Constitutional:  Negative for chills, fatigue and fever.  Eyes:  Negative for pain and visual disturbance.  Respiratory:  Negative for apnea, shortness of breath and wheezing.   Cardiovascular:  Negative for chest pain and palpitations.  Gastrointestinal:  Negative for abdominal pain, constipation, diarrhea, nausea and vomiting.  Genitourinary:  Negative for difficulty urinating, dysuria, pelvic pain, vaginal bleeding, vaginal discharge and vaginal pain.  Musculoskeletal:  Negative for back pain.  Neurological:  Negative for seizures, weakness and headaches.  Psychiatric/Behavioral:  Negative for suicidal ideas.    Maternal Medical History:  Reason for admission: Nausea.  Contractions: Frequency: irregular.   Fetal activity: Perceived fetal activity is normal.   Prenatal complications: no prenatal complications Prenatal Complications - Diabetes: none.     Last menstrual period 03/23/2023. Maternal Exam:  Uterine Assessment: Contraction frequency is irregular.  Abdomen: Patient reports no abdominal tenderness. Fetal presentation: vertex Cervix: Cervix evaluated by digital exam.     Fetal Exam Fetal Monitor Review: Baseline rate: 135.  Variability: moderate (6-25 bpm).   Pattern: accelerations present and no decelerations.   Fetal State Assessment: Category I - tracings are normal.   Physical Exam Vitals and nursing note reviewed.  Constitutional:      General: She is not in acute distress.    Appearance: Normal appearance.  HENT:     Head: Normocephalic.  Pulmonary:     Effort: Pulmonary effort is normal.  Musculoskeletal:     Cervical back: Normal range of motion.   Skin:  General: Skin is warm and dry.  Neurological:     Mental Status: She is alert and oriented to person, place, and time.  Psychiatric:        Mood and Affect: Mood normal.     Prenatal labs: ABO, Rh: A/Positive/-- (05/13 1538) Antibody: Negative (05/13 1538) Rubella: 11.90 (05/13 1538) RPR: Non Reactive (09/02 0930)  HBsAg: Negative (05/13 1538)  HIV: Non Reactive (09/02 0930)  GBS: Negative/-- (10/27 1630)   Assessment/Plan: Alan Olszewski , a  38 y.o. G1P0000 at [redacted]w[redacted]d presents for IOL for Postdates. Patient desires WB. Consents confirmed in Chart. Discussed with Dr. Eldonna for contraindications, and MD agreeable to plan of care.   Labor: Dual Cytotec FWB :Cat I  Pain: Desires WB  I/D: GBS negative  MOF: lactation consult. Bottle/ Bi-lateral Vasectomy  Circ: N/A  MOC: unsure MOD: Hopeful for vaginal delivery.   Claris CHRISTELLA Cedar, MSN CNM  01/08/2024, 6:11 PM

## 2024-01-09 DIAGNOSIS — O1490 Unspecified pre-eclampsia, unspecified trimester: Secondary | ICD-10-CM | POA: Diagnosis present

## 2024-01-09 DIAGNOSIS — Z8759 Personal history of other complications of pregnancy, childbirth and the puerperium: Secondary | ICD-10-CM | POA: Diagnosis present

## 2024-01-09 LAB — PROTEIN / CREATININE RATIO, URINE
Creatinine, Urine: 39 mg/dL
Protein Creatinine Ratio: 0.69 mg/mg{creat} — ABNORMAL HIGH (ref 0.00–0.15)
Total Protein, Urine: 27 mg/dL

## 2024-01-09 LAB — COMPREHENSIVE METABOLIC PANEL WITH GFR
ALT: 10 U/L (ref 0–44)
AST: 19 U/L (ref 15–41)
Albumin: 2.6 g/dL — ABNORMAL LOW (ref 3.5–5.0)
Alkaline Phosphatase: 133 U/L — ABNORMAL HIGH (ref 38–126)
Anion gap: 11 (ref 5–15)
BUN: 8 mg/dL (ref 6–20)
CO2: 15 mmol/L — ABNORMAL LOW (ref 22–32)
Calcium: 8.3 mg/dL — ABNORMAL LOW (ref 8.9–10.3)
Chloride: 108 mmol/L (ref 98–111)
Creatinine, Ser: 0.67 mg/dL (ref 0.44–1.00)
GFR, Estimated: >60 mL/min (ref >60)
Glucose, Bld: 99 mg/dL (ref 70–99)
Potassium: 4 mmol/L (ref 3.5–5.1)
Sodium: 134 mmol/L — ABNORMAL LOW (ref 135–145)
Total Bilirubin: 0.5 mg/dL (ref 0.0–1.2)
Total Protein: 5.3 g/dL — ABNORMAL LOW (ref 6.5–8.1)

## 2024-01-09 MED ORDER — PHENYLEPHRINE 80 MCG/ML (10ML) SYRINGE FOR IV PUSH (FOR BLOOD PRESSURE SUPPORT): 80.0000 ug | PREFILLED_SYRINGE | INTRAVENOUS | Status: DC | PRN

## 2024-01-09 MED ORDER — EPHEDRINE 5 MG/ML INJ: 10.0000 mg | INTRAVENOUS | Status: DC | PRN

## 2024-01-09 MED ORDER — DIPHENHYDRAMINE HCL 50 MG/ML IJ SOLN: 12.5000 mg | INTRAMUSCULAR | Status: DC | PRN

## 2024-01-09 MED ORDER — OXYTOCIN-SODIUM CHLORIDE 30-0.9 UT/500ML-% IV SOLN
1.0000 m[IU]/min | INTRAVENOUS | Status: DC
  Administered 2024-01-09: 2 m[IU]/min via INTRAVENOUS
  Filled 2024-01-09 (×2): qty 500

## 2024-01-09 MED ORDER — TERBUTALINE SULFATE 1 MG/ML IJ SOLN: 0.2500 mg | Freq: Once | INTRAMUSCULAR | Status: DC | PRN

## 2024-01-09 MED ORDER — LACTATED RINGERS IV SOLN: 500.0000 mL | Freq: Once | INTRAVENOUS | Status: DC

## 2024-01-09 MED ORDER — LACTATED RINGERS IV SOLN: INTRAVENOUS | Status: AC

## 2024-01-09 MED ORDER — LACTATED RINGERS IV SOLN: 500.0000 mL | INTRAVENOUS | Status: AC | PRN

## 2024-01-09 MED ORDER — FENTANYL-BUPIVACAINE-NACL 0.5-0.125-0.9 MG/250ML-% EP SOLN: 12.0000 mL/h | EPIDURAL | Status: DC | PRN

## 2024-01-09 NOTE — Progress Notes (Signed)
 Patient ID: Sandy Sullivan, female   DOB: 22-May-1985, 38 y.o.   MRN: [id number]   S/p dual cytotec ; feeling period-type cramping now  BPs 142/104 (taken after foley placement), 132/91 FHR 135-140s, +accels, occ variables Ctx q 2 mins Cx 1+/70/vtx -2; soft  IUP@41 .5wks IOL process ^BP  - Cervical foley inserted with ease and inflated with 60cc fluid; will hold off on additional cytotec  dosing for now due to freq ctx - Most likely will plan for Pitocin  when foley dislodges - Run CMP and P/C ratio  Suzen JONETTA Gentry CNM 01/09/2024 1:10 AM

## 2024-01-09 NOTE — Progress Notes (Signed)
 LABOR NOTE Sandy Sullivan is a 38 y.o. G1P0000 at [redacted]w[redacted]d admitted for induction of labor due to postdates and now PreE w/o SF  Subjective: beginning to get uncomfortable w/ contractions  Objective: BP 135/89   Pulse 78   Temp 98.5 F (36.9 C) (Axillary)   Resp 15   Ht 5' 4 (1.626 m)   Wt 82.8 kg   LMP 03/23/2023   SpO2 100%   BMI 31.34 kg/m  No intake/output data recorded.  FHR baseline 135 bpm, Variability: moderate, Accelerations:present, Decelerations:  Absent Toco: q 2-4 mins   SVE:   Dilation: 4.5 Effacement (%): 70, 80 Station: 0, -1 Exam by:: Jaimie Ebersole, RN  Pitocin @ 10 mu/min  Labs: Lab Results  Component Value Date   WBC 7.4 01/08/2024   HGB 11.4 (L) 01/08/2024   HCT 33.0 (L) 01/08/2024   MCV 83.8 01/08/2024   PLT 209 01/08/2024   Assessment / Plan: IOL d/t postdates and preeclampsia w/o severe features,    Labor: Continue to titrate pit up as needed.  Fetal Wellbeing:  Category I Pain Control:  labor support without medications, patients doula and mother have arrived. Encouraged frequent position changes  Pre-eclampsia: asymptomatic and labs stable I/D:  GBS neg Anticipated MOD: hopeful for vaginal birth  Claris CHRISTELLA Cedar MSN, CNM  01/09/2024, 5:26 PM

## 2024-01-09 NOTE — Progress Notes (Signed)
 Patient ID: Sandy Sullivan, female   DOB: 03/12/1985, 38 y.o.   MRN: [id number]  Was able to rest some last night after the foley came out; denies H/A or visual changes  BPs have remained consistently mildly elevated FHR 130-140s, +accels, no decels Ctx irreg/mild Cx 3-4/50/vtx -2  P/C: 0.69 Bloodwork neg for pre-e  IUP@41 .5wk New pre-e without SF IOL process  -Discussion regarding pre-e and what that means in general and specifically as far as waterbirth no longer being an option> she understands this -Recommended starting Pitocin to assist with IOL process> family is agreeable with this option -Will uptitrate to try to get into active labor  Sandy Sullivan CNM 01/09/2024 7:17 AM

## 2024-01-09 NOTE — Progress Notes (Signed)
 Late Entry due to Labor acuity:   CNM at bedside ~1150am  Patient ID: Sandy Sullivan, female   DOB: [birth year], 38 y.o.   MRN: [id number]    Subjective: Pt doing well. She is beginning to feel come contractions.   Objective: BP (!) 140/94   Pulse 81   Temp 98.5 F (36.9 C) (Axillary)   Resp 15   Ht 5' 4 (1.626 m)   Wt 82.8 kg   LMP 03/23/2023   SpO2 100%   BMI 31.34 kg/m  No intake/output data recorded. No intake/output data recorded.  FHT:  FHR: 130 bpm, variability: moderate,  accelerations:  Present,  decelerations:  Absent UC:   regular, every 1-3 minutes SVE:   Dilation: 4.5 Effacement (%): 70, 80 Station: 0, -1 Exam by:: CANDIE Cedar CNM   Patient on 6 of pit.   Labs: Lab Results  Component Value Date   WBC 7.4 01/08/2024   HGB 11.4 (L) 01/08/2024   HCT 33.0 (L) 01/08/2024   MCV 83.8 01/08/2024   PLT 209 01/08/2024  Patient Vitals for the past 24 hrs:  BP Temp Temp src Pulse Resp SpO2 Height Weight  01/09/24 1646 135/89 -- -- 78 -- -- -- --  01/09/24 1630 -- -- -- -- -- 100 % -- --  01/09/24 1625 -- -- -- -- -- 99 % -- --  01/09/24 1524 (!) 140/94 -- -- 81 -- -- -- --  01/09/24 1521 -- 98.5 F (36.9 C) Axillary -- -- -- -- --  01/09/24 1441 (!) 149/95 -- -- 74 -- -- -- --  01/09/24 1343 (!) 149/91 -- -- 74 -- -- -- --  01/09/24 1340 -- 98.6 F (37 C) Axillary -- -- -- -- --  01/09/24 1226 (!) 129/91 98.1 F (36.7 C) Oral 79 15 -- -- --  01/09/24 1130 (!) 126/94 -- -- 79 -- -- -- --  01/09/24 0941 124/79 -- -- 69 -- -- -- --  01/09/24 0858 (!) 136/94 -- -- 70 14 -- -- --  01/09/24 0808 -- 98.5 F (36.9 C) Oral -- -- -- -- --  01/09/24 0611 136/88 -- -- 66 -- -- -- --  01/09/24 0502 (!) 136/99 -- -- 80 -- -- -- --  01/09/24 0409 (!) 134/92 -- -- 67 -- -- -- --  01/09/24 0405 (!) 137/92 98.1 F (36.7 C) Oral 70 -- -- -- --  01/09/24 0135 (!) 145/95 -- -- 97 16 -- -- --  01/09/24 0125 -- -- -- -- -- 100 % -- --  01/09/24 0120 -- -- -- -- -- 100 % --  --  01/09/24 0115 -- -- -- -- -- 100 % -- --  01/09/24 0045 -- -- -- -- -- 99 % -- --  01/09/24 0040 -- -- -- -- -- 99 % -- --  01/09/24 0035 -- -- -- -- -- 99 % -- --  01/09/24 0030 -- -- -- -- -- 98 % -- --  01/09/24 0025 -- -- -- -- -- 98 % -- --  01/09/24 0020 -- -- -- -- -- 99 % -- --  01/09/24 0015 -- -- -- -- -- 99 % -- --  01/09/24 0010 -- -- -- -- -- 99 % -- --  01/09/24 0005 -- -- -- -- -- 99 % -- --  01/09/24 0002 (!) 142/104 (!) 97.5 F (36.4 C) -- 73 16 -- -- --  01/09/24 0000 -- -- -- -- -- 100 % -- --  01/08/24 1909 ROLLEN)  132/91 -- -- 72 -- -- -- --  01/08/24 1751 129/81 98.2 F (36.8 C) Oral 74 16 -- 5' 4 (1.626 m) 82.8 kg   CNM to patient bedside to discuss AROM. Reviewed risks and benefits with patient and FOB. Patient desires to proceed with AROM. FHT Cat 1 prior to AROM. SVE 4/70/-1 S. Emilio CNM. Fetal head well applied to cervix, AROM successful on first attempt with the use of amnihook. Large amount of cleat fluid noted. Patient and fetus tolerated well and FHT remained Cat 1 following AROM.    Assessment / Plan: IOL for PD and now PreE w/o SF and progressing well s/p cytotec FB Pit and now AROM.   Labor: Plan to continue to titrate pit up as needed.  Preeclampsia:  labs stable and BPs elevated but not severe range.  Fetal Wellbeing:  Category I Pain Control:  Labor support without medications I/D:  GBS negative  Anticipated MOD:  hopeful for vaginal delivery.   [name], CNM 01/09/2024, 5:03 PM

## 2024-01-09 NOTE — Progress Notes (Addendum)
 Labor Progress Note Sandy Sullivan is a 38 y.o. G1P0000 at [redacted]w[redacted]d presented for  induction of labor due to postdates and now PreE w/o SF   S:  Breathing well through contractions  O:  BP (!) 150/88   Pulse 89   Temp 98 F (36.7 C) (Axillary)   Resp 16   Ht 5' 4 (1.626 m)   Wt 82.8 kg   LMP 03/23/2023   SpO2 100%   BMI 31.34 kg/m   EFM: baseline 140 bpm/ moderate variability/ 15x15 accels/ early decels  Toco/IUPC: Q89min per doula SVE: Dilation: 5 Effacement (%): 70, 80 Cervical Position: Posterior Station: -1 Presentation: Vertex Exam by:: Stormy, SNM  Pitocin: 12 mu/min  A/P: 38 y.o. G1P0000 [redacted]w[redacted]d IOL d/t postdates and preeclampsia w/o severe feature   1. Labor: Latent phase of labor. Continue to titrate pitocin up as needed. Pt declined IUPC 2. FWB: Cat 1 3. Pain: coping well with labor support from doula and family. 4. Pre-eclampsia: asymptomatic and labs stable  Anticipate for SVB.  Stormy VEAR Me, Student-MidWife 10:04 PM    Evaluation and management procedures were performed by the Family Medicine Resident/PA or Medical Student/Student Nurse Midwife under my supervision. I was immediately available for direct supervision, assistance and direction throughout this encounter.  I also confirm that I have verified the information documented in the resident's note, and that I have also personally reperformed the pertinent components of the physical exam and all of the medical decision making activities.  I have also made any necessary editorial changes.  Cathlean Ely, CNM [place], [facility] 01/09/2024 10:44 PM

## 2024-01-10 ENCOUNTER — Inpatient Hospital Stay (HOSPITAL_COMMUNITY): Admitting: Anesthesiology

## 2024-01-10 ENCOUNTER — Encounter (HOSPITAL_COMMUNITY): Admission: AD | Disposition: A | Payer: Self-pay | Source: Home / Self Care | Attending: Obstetrics & Gynecology

## 2024-01-10 ENCOUNTER — Encounter (HOSPITAL_COMMUNITY): Payer: Self-pay | Admitting: Obstetrics and Gynecology

## 2024-01-10 DIAGNOSIS — Z3A41 41 weeks gestation of pregnancy: Secondary | ICD-10-CM

## 2024-01-10 DIAGNOSIS — O09523 Supervision of elderly multigravida, third trimester: Secondary | ICD-10-CM

## 2024-01-10 DIAGNOSIS — O480 Post-term pregnancy: Secondary | ICD-10-CM

## 2024-01-10 DIAGNOSIS — O4423 Partial placenta previa NOS or without hemorrhage, third trimester: Secondary | ICD-10-CM

## 2024-01-10 LAB — CBC
HCT: 37.3 % (ref 36.0–46.0)
HCT: 33.4 % — ABNORMAL LOW (ref 36.0–46.0)
Hemoglobin: 12.6 g/dL (ref 12.0–15.0)
Hemoglobin: 11.1 g/dL — ABNORMAL LOW (ref 12.0–15.0)
MCH: 29 pg (ref 26.0–34.0)
MCH: 28.8 pg (ref 26.0–34.0)
MCHC: 33.8 g/dL (ref 30.0–36.0)
MCHC: 33.2 g/dL (ref 30.0–36.0)
MCV: 85.9 fL (ref 80.0–100.0)
MCV: 86.5 fL (ref 80.0–100.0)
Platelets: 190 K/uL (ref 150–400)
Platelets: 159 K/uL (ref 150–400)
RBC: 4.34 MIL/uL (ref 3.87–5.11)
RBC: 3.86 MIL/uL — ABNORMAL LOW (ref 3.87–5.11)
RDW: 14.7 % (ref 11.5–15.5)
RDW: 14.9 % (ref 11.5–15.5)
WBC: 16.5 K/uL — ABNORMAL HIGH (ref 4.0–10.5)
WBC: 17.1 K/uL — ABNORMAL HIGH (ref 4.0–10.5)
nRBC: 0 % (ref 0.0–0.2)
nRBC: 0 % (ref 0.0–0.2)

## 2024-01-10 LAB — SYPHILIS: RPR W/REFLEX TO RPR TITER AND TREPONEMAL ANTIBODIES, TRADITIONAL SCREENING AND DIAGNOSIS ALGORITHM: RPR Ser Ql: NONREACTIVE

## 2024-01-10 MED ORDER — FENTANYL CITRATE (PF) 100 MCG/2ML IJ SOLN: 25.0000 ug | INTRAMUSCULAR | Status: DC | PRN

## 2024-01-10 MED ORDER — CEFAZOLIN SODIUM-DEXTROSE 2-3 GM-%(50ML) IV SOLR
INTRAVENOUS | Status: DC | PRN
  Administered 2024-01-10: 2 g via INTRAVENOUS

## 2024-01-10 MED ORDER — SCOPOLAMINE 1 MG/3DAYS TD PT72: 1.0000 | MEDICATED_PATCH | Freq: Once | TRANSDERMAL | Status: DC

## 2024-01-10 MED ORDER — BUPIVACAINE IN DEXTROSE 0.75-8.25 % IT SOLN
INTRATHECAL | Status: DC | PRN
  Administered 2024-01-10: 1.6 mL via INTRATHECAL

## 2024-01-10 MED ORDER — MEPERIDINE HCL 25 MG/ML IJ SOLN: 6.2500 mg | INTRAMUSCULAR | Status: DC | PRN

## 2024-01-10 MED ORDER — DIPHENHYDRAMINE HCL 50 MG/ML IJ SOLN: 12.5000 mg | INTRAMUSCULAR | Status: DC | PRN

## 2024-01-10 MED ORDER — NALOXONE HCL 0.4 MG/ML IJ SOLN: 0.4000 mg | INTRAMUSCULAR | Status: DC | PRN

## 2024-01-10 MED ORDER — ONDANSETRON HCL 4 MG/2ML IJ SOLN
INTRAMUSCULAR | Status: DC | PRN
  Administered 2024-01-10: 4 mg via INTRAVENOUS

## 2024-01-10 MED ORDER — DEXMEDETOMIDINE HCL IN NACL 80 MCG/20ML IV SOLN
INTRAVENOUS | Status: DC | PRN
  Administered 2024-01-10 (×2): 10 ug via INTRAVENOUS

## 2024-01-10 MED ORDER — SODIUM CHLORIDE 0.9% FLUSH: 3.0000 mL | INTRAVENOUS | Status: DC | PRN

## 2024-01-10 MED ORDER — LACTATED RINGERS IV SOLN: INTRAVENOUS | Status: DC | PRN

## 2024-01-10 MED ORDER — ACETAMINOPHEN 10 MG/ML IV SOLN
INTRAVENOUS | Status: DC | PRN
  Administered 2024-01-10: 1000 mg via INTRAVENOUS

## 2024-01-10 MED ORDER — ONDANSETRON HCL 4 MG/2ML IJ SOLN: 4.0000 mg | Freq: Three times a day (TID) | INTRAMUSCULAR | Status: DC | PRN

## 2024-01-10 MED ORDER — OXYTOCIN-SODIUM CHLORIDE 30-0.9 UT/500ML-% IV SOLN
INTRAVENOUS | Status: DC | PRN
  Administered 2024-01-10: 999 mL/h via INTRAVENOUS

## 2024-01-10 MED ORDER — FENTANYL CITRATE (PF) 100 MCG/2ML IJ SOLN
INTRAMUSCULAR | Status: DC | PRN
  Administered 2024-01-10: 15 ug via INTRAVENOUS

## 2024-01-10 MED ORDER — MORPHINE SULFATE (PF) 0.5 MG/ML IJ SOLN
INTRAMUSCULAR | Status: DC | PRN
  Administered 2024-01-10: 150 ug via EPIDURAL

## 2024-01-10 MED ORDER — DEXAMETHASONE SODIUM PHOSPHATE 4 MG/ML IJ SOLN
INTRAMUSCULAR | Status: DC | PRN
  Administered 2024-01-10: 5 mg via INTRAVENOUS

## 2024-01-10 MED ORDER — CEFAZOLIN SODIUM-DEXTROSE 2-4 GM/100ML-% IV SOLN: 2.0000 g | INTRAVENOUS | Status: DC

## 2024-01-10 MED ORDER — MORPHINE SULFATE (PF) 0.5 MG/ML IJ SOLN
INTRAMUSCULAR | Status: AC
  Filled 2024-01-10: qty 10

## 2024-01-10 MED ORDER — NALOXONE HCL 4 MG/10ML IJ SOLN: 1.0000 ug/kg/h | INTRAVENOUS | Status: DC | PRN

## 2024-01-10 MED ORDER — CALCIUM CARBONATE ANTACID 500 MG PO CHEW
2.0000 | CHEWABLE_TABLET | Freq: Once | ORAL | Status: AC
  Administered 2024-01-10: 400 mg via ORAL
  Filled 2024-01-10: qty 2

## 2024-01-10 MED ORDER — FENTANYL CITRATE (PF) 100 MCG/2ML IJ SOLN
INTRAMUSCULAR | Status: AC
  Filled 2024-01-10: qty 2

## 2024-01-10 MED ORDER — PHENYLEPHRINE HCL-NACL 20-0.9 MG/250ML-% IV SOLN
INTRAVENOUS | Status: DC | PRN
  Administered 2024-01-10: 60 ug/min via INTRAVENOUS

## 2024-01-10 MED ORDER — ACETAMINOPHEN 500 MG PO TABS
1000.0000 mg | ORAL_TABLET | Freq: Four times a day (QID) | ORAL | Status: AC
  Administered 2024-01-11 (×4): 1000 mg via ORAL
  Filled 2024-01-10 (×5): qty 2

## 2024-01-10 MED ORDER — STERILE WATER FOR IRRIGATION IR SOLN
Status: DC | PRN
  Administered 2024-01-10: 1000 mL

## 2024-01-10 MED ORDER — DIPHENHYDRAMINE HCL 25 MG PO CAPS: 25.0000 mg | ORAL_CAPSULE | ORAL | Status: DC | PRN

## 2024-01-10 MED ORDER — SOD CITRATE-CITRIC ACID 500-334 MG/5ML PO SOLN
30.0000 mL | ORAL | Status: AC
  Administered 2024-01-10: 30 mL via ORAL

## 2024-01-10 MED ORDER — METOCLOPRAMIDE HCL 5 MG/ML IJ SOLN
INTRAMUSCULAR | Status: DC | PRN
  Administered 2024-01-10: 10 mg via INTRAVENOUS

## 2024-01-10 MED ORDER — SODIUM CHLORIDE 0.9 % IV SOLN
500.0000 mg | INTRAVENOUS | Status: AC
  Administered 2024-01-10: 500 mg via INTRAVENOUS

## 2024-01-10 SURGICAL SUPPLY — 1 items: NDL HYPO 25X5/8 SAFETYGLIDE (NEEDLE) IMPLANT

## 2024-01-10 NOTE — Anesthesia Preprocedure Evaluation (Signed)
 Anesthesia Evaluation  Patient identified by MRN, date of birth, ID band Patient awake    Reviewed: Allergy & Precautions, NPO status , Patient's Chart, lab work & pertinent test results  Airway Mallampati: II  TM Distance: >3 FB Neck ROM: Full    Dental  (+) Dental Advisory Given   Pulmonary asthma    breath sounds clear to auscultation       Cardiovascular hypertension (Pre-E),  Rhythm:Regular Rate:Normal     Neuro/Psych negative neurological ROS     GI/Hepatic negative GI ROS, Neg liver ROS,,,  Endo/Other  negative endocrine ROS    Renal/GU negative Renal ROS     Musculoskeletal   Abdominal   Peds  Hematology  (+) Blood dyscrasia (Factor V Leiden hetereozygous)   Anesthesia Other Findings Hx breast CA s/ bilateral mastectomy  Reproductive/Obstetrics (+) Pregnancy (IOL for post dates now with pre-E)                              Anesthesia Physical Anesthesia Plan  ASA: 3  Anesthesia Plan: Spinal   Post-op Pain Management:    Induction:   PONV Risk Score and Plan: 2 and Dexamethasone , Ondansetron  and Treatment may vary due to age or medical condition  Airway Management Planned: Natural Airway  Additional Equipment:   Intra-op Plan:   Post-operative Plan:   Informed Consent: I have reviewed the patients History and Physical, chart, labs and discussed the procedure including the risks, benefits and alternatives for the proposed anesthesia with the patient or authorized representative who has indicated his/her understanding and acceptance.       Plan Discussed with: CRNA  Anesthesia Plan Comments:         Anesthesia Quick Evaluation

## 2024-01-10 NOTE — Progress Notes (Signed)
 Patient Vitals for the past 4 hrs:  BP Temp Temp src Pulse Resp  01/10/24 0556 (!) 146/99 -- -- 92 18  01/10/24 0430 130/67 -- -- 89 --  01/10/24 0425 (!) 144/84 -- -- 95 18  01/10/24 0424 -- 99.5 F (37.5 C) Oral -- --  01/10/24 0330 127/70 -- -- 90 16  01/10/24 0300 125/71 -- -- 84 16  01/10/24 0230 (!) 150/93 -- -- 87 16   Ctx are getting weaker per pt.  MVUs ~ .  FHR Cat 1.  Will take a pitocin  break until 0600 then restart at 10 mu/min. Pt agreeable to plan.

## 2024-01-10 NOTE — Op Note (Addendum)
 Cesarean Section Operative Note   Patient: Sandy Sullivan  Date of Procedure: 01/08/2024 - 01/10/2024  Procedure: Primary Low Transverse Cesarean   Indications: failure to progress: arrest of dilation at 4 cm and failed induction  Pre-operative Diagnosis: primary cesarean section; failed induction; arrest of dilation.   Post-operative Diagnosis: Same  TOLAC Candidate: N/A  Surgeon: Surgeons and Role:    * Ozan, Jennifer, DO - Primary  Assistants: DEWAINE Courts, Charlie LABOR, MD - Fellow  An experienced assistant was required given the standard of surgical care given the complexity of the case.  This assistant was needed for exposure, dissection, suctioning, retraction, instrument exchange, assisting with delivery with administration of fundal pressure, and for overall help during the procedure.   Anesthesia: spinal  Anesthesiologist: Epifanio Charleston, MD   Antibiotics: Cefazolin  and Azithromycin    Estimated Blood Loss: 438 ml   Total IV Fluids: 500 ml  Urine Output: 75 cc OF clear urine  Specimens: none   Complications: no complications   Indications: Sandy Sullivan is a 38 y.o. G1P0000 with an IUP 108w6d presenting for unscheduled, urgent cesarean secondary to the indications listed above. Clinical course notable for failed induction necessitating 1LTCS.  The risks of cesarean section discussed with the patient included but were not limited to: bleeding which may require transfusion or reoperation; infection which may require antibiotics; injury to bowel, bladder, ureters or other surrounding organs; injury to the fetus; need for additional procedures including hysterectomy in the event of a life-threatening hemorrhage; placental abnormalities with subsequent pregnancies, incisional problems, thromboembolic phenomenon and other postoperative/anesthesia complications. The patient concurred with the proposed plan, giving informed written consent for the procedure. Patient NPO  status waived given urgency of case. Anesthesia and OR aware. Preoperative prophylactic antibiotics and SCDs ordered on call to the OR.   Findings: Viable infant in cephalic presentation, no nuchal cord present. Apgars pending . Weight 4020 g. Clear amniotic fluid. Normal placenta, three vessel cord. Normal uterus, Normal bilateral fallopian tubes, Normal bilateral ovaries. No adhesive disease was encountered.  Procedure Details: A Time Out was held and the above information confirmed. The patient received intravenous antibiotics and had sequential compression devices applied to her lower extremities preoperatively. The patient was taken back to the operative suite where spinal anesthesia was administered. After induction of anesthesia, the patient was draped and prepped in the usual sterile manner and placed in a dorsal supine position with a leftward tilt. A low transverse skin incision was made with scalpel and carried down through the subcutaneous tissue to the fascia. Fascial incision was made and extended transversely.  The rectus muscles were separated in the midline bluntly and the peritoneum was entered bluntly. An Alexis retractor was placed to aid in visualization of the uterus. A bladder flap was not developed. A low transverse uterine incision was made. The infant was successfully delivered from cephalic presentation, the umbilical cord was clamped after 1 minute. Cord ph was not sent, and cord blood was obtained for evaluation. The placenta was removed Intact and appeared normal. The uterine incision was closed with a single layer running unlocked suture of 0-vicryl. Due to ongoing bleeding from the hysterotomy figure of eight sutures and horizontal mattress sutures of 0 vicryl were placed after which there was excellent hemostasis. The abdomen and the pelvis were cleared of all clot and debris and the Thersia was removed. Hemostasis was confirmed on all surfaces.  The peritoneum was reapproximated  using 2-0 vicryl . The fascia was then closed using 0  Vicryl in a running fashion. The subcutaneous layer was reapproximated with 2-0 plain gut suture. The skin was closed with a 4-0 vicryl subcuticular stitch. The patient tolerated the procedure well. Sponge, lap, instrument and needle counts were correct x 2. She was taken to the recovery room in stable condition.  Disposition: PACU - hemodynamically stable.    Signed: Charlie DELENA Courts, MD Family Medicine - Obstetrics Fellow, [facility] for [facility], [facility]

## 2024-01-10 NOTE — Progress Notes (Signed)
 LABOR PROGRESS NOTE  Patient Name: Sandy Sullivan, female   DOB: [birth year], 38 y.o.  MRN: [id number]  G1P0000 at [redacted]w[redacted]d admitted for IOL due to postterm pregnancy  S: Feeling painful contractions, but comfortably walking around   O:  BP (!) 141/81   Pulse 92   Temp (!) 97.1 F (36.2 C) (Axillary)   Resp 16   Ht 5' 4 (1.626 m)   Wt 82.8 kg   LMP 03/23/2023   SpO2 100%   BMI 31.34 kg/m   EFM:140bpm/Moderate variability/ 15x15 accels/ None decels CAT: 1 Toco: regular, occasional hours of adequate contractions, currently MVU 100s   CVE: Dilation: 4 Effacement (%): 80 Cervical Position: Posterior Station: -2 Presentation: Vertex Exam by:: Bernabe Dorce MD   A&P:   #Labor: Cervix remains unchanged.  Discussed failed IOL as pt has been unchanged for over 24hrs despite IUPC and Pitocin . Recommendation to proceed with primary C-section. The risks of surgery were discussed with the patient including but were not limited to: bleeding which may require transfusion or reoperation; infection which may require antibiotics; injury to bowel, bladder, ureters or other surrounding organs; injury to the fetus; need for additional procedures including hysterectomy in the event of a life-threatening hemorrhage; formation of adhesions; placental abnormalities with subsequent pregnancies; incisional problems; thromboembolic phenomenon and other postoperative/anesthesia complications.  The patient concurred with the proposed plan, giving informed written consent for the procedure.    Anesthesia and OR aware. Preoperative prophylactic antibiotics and SCDs ordered on call to the OR.  To OR when ready.  #FWB: CAT 1 #GBS negative   Sanoe Hazan, DO Attending Obstetrician & Gynecologist, [facility] for [employer], [facility]

## 2024-01-10 NOTE — Progress Notes (Signed)
 Labor Progress Note  In to see patient. She is s/p pit break this AM and has been on pitocin  for ~6 hours now. Currently at 12ml/h. She is uncomfortable with contractions.  SCE unchanged from prior FHR cat I We discussed CS for failed induction of labor now that she is 24+ hours ruptured with pitocin  augmentation. Maternal & fetal status is otherwise reassuring, so she would like to give it a little more time.  Continue to titrate pitocin . Plan to recheck in 4 hours (~7:30p).   Kieth Carolin, MD Obstetrician & Gynecologist, [facility] for [employer], [facility]

## 2024-01-10 NOTE — Discharge Summary (Signed)
 Postpartum Discharge Summary    Patient Name: Sandy Sullivan DOB: 01-08-1986 MRN: [id number]  Date of admission: 01/08/2024 Delivery date:01/10/2024 Delivering provider: MARILYNN NEST Date of discharge: 01/13/2024  Admitting diagnosis: Indication for care in labor or delivery [O75.9] Intrauterine pregnancy: [redacted]w[redacted]d     Secondary diagnosis:  Principal Problem:   Indication for care in labor or delivery Active Problems:   History of ductal carcinoma in situ (DCIS) of breast   AMA (advanced maternal age) primigravida 35+   Factor 5 Leiden mutation, heterozygous   Intrauterine synechiae   Placenta succenturiata, third trimester   Marginal insertion of umbilical cord affecting management of mother   Preeclampsia  Additional problems:     Discharge diagnosis: Term Pregnancy Delivered                                              Post partum procedures: none Augmentation: AROM, Pitocin , Cytotec , and IP Foley Complications: None  Hospital course: Induction of Labor With Cesarean Section   38 y.o. yo G1P1001 at [redacted]w[redacted]d was admitted to the hospital 01/08/2024 for induction of labor. Patient had a labor course significant for arrest of dilation. The patient went for cesarean section due to Arrest of Dilation and failed IOL. Delivery details are as follows: Membrane Rupture Time/Date: 12:00 PM,01/09/2024  Delivery Method:C-Section, Low Transverse Operative Delivery:N/A Details of operation can be found in separate operative Note.  Patient had a postpartum course complicated by LTPCS. She is ambulating, tolerating a regular diet, passing flatus, and urinating well.  Patient is discharged home in stable condition on 01/13/24.      Newborn Data: Birth date:01/10/2024 Birth time:9:38 PM Gender:Female Living status:Living Apgars:9 ,9  Weight:4020 g                               Magnesium Sulfate received: No BMZ received: No Rhophylac:N/A MMR:N/A T-DaP:declined Flu: declined RSV Vaccine  received: No Transfusion:No  Immunizations received: There is no immunization history for the selected administration types on file for this patient.  Physical exam  Vitals:   01/12/24 1654 01/12/24 1836 01/12/24 2022 01/13/24 0610  BP: (!) 136/97 131/87 125/83 117/79  Pulse:  98 97 95  Resp:   18 20  Temp:   98.5 F (36.9 C) 98.3 F (36.8 C)  TempSrc:   Oral Oral  SpO2:   98% 98%  Weight:      Height:       General: alert, cooperative, and no distress Lochia: appropriate Incision: Healing well with no significant drainage DVT Evaluation: No evidence of DVT seen on physical exam. Labs: Lab Results  Component Value Date   WBC 20.6 (H) 01/11/2024   HGB 10.6 (L) 01/11/2024   HCT 31.2 (L) 01/11/2024   MCV 85.2 01/11/2024   PLT 161 01/11/2024      Latest Ref Rng & Units 01/08/2024    6:34 PM  CMP  Glucose 70 - 99 mg/dL 99   BUN 6 - 20 mg/dL 8   Creatinine 9.55 - 8.99 mg/dL 9.32   Sodium 864 - 854 mmol/L 134   Potassium 3.5 - 5.1 mmol/L 4.0   Chloride 98 - 111 mmol/L 108   CO2 22 - 32 mmol/L 15   Calcium  8.9 - 10.3 mg/dL 8.3   Total Protein 6.5 - 8.1 g/dL 5.3  Total Bilirubin 0.0 - 1.2 mg/dL 0.5   Alkaline Phos 38 - 126 U/L 133   AST 15 - 41 U/L 19   ALT 0 - 44 U/L 10    Edinburgh Score:    01/11/2024    3:00 PM  Edinburgh Postnatal Depression Scale Screening Tool  I have been able to laugh and see the funny side of things. 0  I have looked forward with enjoyment to things. 0  I have blamed myself unnecessarily when things went wrong. 1  I have been anxious or worried for no good reason. 2  I have felt scared or panicky for no good reason. 0  Things have been getting on top of me. 1  I have been so unhappy that I have had difficulty sleeping. 0  I have felt sad or miserable. 0  I have been so unhappy that I have been crying. 0  The thought of harming myself has occurred to me. 0  Edinburgh Postnatal Depression Scale Total 4   No data recorded  After  visit meds:  Allergies as of 01/13/2024       Reactions   Sulfa Antibiotics Hives, Swelling   Aspirin Hives   Ibuprofen Hives        Medication List     TAKE these medications    acetaminophen  325 MG tablet Commonly known as: TYLENOL  Take 2 tablets (650 mg total) by mouth every 4 (four) hours as needed for mild pain (pain score 1-3) (temperature > 101.5.).   enoxaparin  40 MG/0.4ML injection Commonly known as: LOVENOX  Inject 0.4 mLs (40 mg total) into the skin daily for 45 doses.   fluticasone  50 MCG/ACT nasal spray Commonly known as: FLONASE  Place 2 sprays into both nostrils daily for 10 days.   furosemide  20 MG tablet Commonly known as: LASIX  Take 1 tablet (20 mg total) by mouth 2 (two) times daily.   loratadine  10 MG tablet Commonly known as: CLARITIN  Take 1 tablet (10 mg total) by mouth daily.   NIFEdipine  30 MG 24 hr tablet Commonly known as: ADALAT  CC Take 1 tablet (30 mg total) by mouth daily. Start taking on: January 14, 2024   OMEGA-3 FISH OIL PO Take by mouth.   oxyCODONE  5 MG immediate release tablet Commonly known as: Oxy IR/ROXICODONE  Take 0.5 tablets (2.5 mg total) by mouth every 4 (four) hours as needed for moderate pain (pain score 4-6).   potassium chloride  SA 20 MEQ tablet Commonly known as: KLOR-CON  M Take 1 tablet (20 mEq total) by mouth daily. Start taking on: January 14, 2024   prenatal multivitamin Tabs tablet Take 1 tablet by mouth daily at 12 noon.   senna-docusate 8.6-50 MG tablet Commonly known as: Senokot-S Take 2 tablets by mouth daily. Start taking on: January 14, 2024   simethicone  80 MG chewable tablet Commonly known as: MYLICON Chew 1 tablet (80 mg total) by mouth 3 (three) times daily after meals.         Discharge home in stable condition Infant Feeding: Bottle Infant Disposition:home with mother Discharge instruction: per After Visit Summary and Postpartum booklet. Activity: Advance as tolerated. Pelvic rest  for 6 weeks.  Diet: routine diet Future Appointments: Future Appointments  Date Time Provider Department Center  01/20/2024  2:50 PM [facility] NURSE [facility]  02/20/2024  1:50 PM Fredirick Glenys RAMAN, MD [facility]  03/05/2024  3:00 PM [facility] None  03/09/2024  3:30 PM Babara Call, MD [facility] None   Follow  up Visit:  Message sent to [facility] on 12/5  Please schedule this patient for a In person postpartum visit in 6 weeks with the following provider: Any provider. Additional Postpartum F/U:Incision check 1 week  High risk pregnancy complicated by: hx of DCIS, AMA, placenta succenturiata Delivery mode:  C-Section, Low Transverse Anticipated Birth Control:  Depo Provera    01/13/2024 Nidia Daring, FNP

## 2024-01-10 NOTE — Transfer of Care (Signed)
 Immediate Anesthesia Transfer of Care Note  Patient: Sandy Sullivan  Procedure(s) Performed: CESAREAN DELIVERY  Patient Location: PACU  Anesthesia Type:Spinal  Level of Consciousness: awake, alert , and oriented  Airway & Oxygen Therapy: Patient Spontanous Breathing  Post-op Assessment: Report given to RN and Post -op Vital signs reviewed and stable  Post vital signs: Reviewed and stable  Last Vitals:  Vitals Value Taken Time  BP 124/77 01/10/24 22:35  Temp    Pulse 96 01/10/24 22:38  Resp 23 01/10/24 22:38  SpO2 97 % 01/10/24 22:38  Vitals shown include unfiled device data.  Last Pain:  Vitals:   01/10/24 2030  TempSrc: Oral  PainSc:          Complications: No notable events documented.

## 2024-01-10 NOTE — Progress Notes (Addendum)
 Patient Vitals for the past 4 hrs:  BP Temp Temp src Pulse Resp  01/10/24 0330 127/70 -- -- 90 16  01/10/24 0300 125/71 -- -- 84 16  01/10/24 0230 (!) 150/93 -- -- 87 16  01/10/24 0212 -- 99 F (37.2 C) Oral -- --  01/10/24 0200 (!) 148/96 -- -- 82 15  01/10/24 0140 (!) 136/94 -- -- 95 16  01/10/24 0100 132/70 -- -- 83 18  01/10/24 0030 (!) 133/94 -- -- 99 16   Breathing with contractions. Cx 5/90/-1.  Pitocin  at 21mu/min, ctx q 2-3 minutes.  FHR 140, moderate variability, + accels, occ mild variable.  IUPC placed.  MVUs ~  . Titrate up pitocin  until labor is adequate.  Pt plans on trying nitrous oxide.

## 2024-01-10 NOTE — Anesthesia Procedure Notes (Signed)
 Spinal  Patient location during procedure: OR Start time: 01/10/2024 9:13 PM End time: 01/10/2024 9:18 PM Reason for block: surgical anesthesia Staffing Performed: anesthesiologist  Anesthesiologist: Epifanio Charleston, MD Performed by: Epifanio Charleston, MD Authorized by: Epifanio Charleston, MD   Preanesthetic Checklist Completed: patient identified, IV checked, site marked, risks and benefits discussed, surgical consent, monitors and equipment checked, pre-op evaluation and timeout performed Spinal Block Patient position: sitting Prep: DuraPrep Patient monitoring: heart rate, cardiac monitor, continuous pulse ox and blood pressure Approach: midline Location: L4-5 Injection technique: single-shot Needle Needle type: Pencan  Needle gauge: 24 G Needle length: 9 cm Assessment Sensory level: T4 Events: CSF return

## 2024-01-11 LAB — CBC
HCT: 31.2 % — ABNORMAL LOW (ref 36.0–46.0)
Hemoglobin: 10.6 g/dL — ABNORMAL LOW (ref 12.0–15.0)
MCH: 29 pg (ref 26.0–34.0)
MCHC: 34 g/dL (ref 30.0–36.0)
MCV: 85.2 fL (ref 80.0–100.0)
Platelets: 161 K/uL (ref 150–400)
RBC: 3.66 MIL/uL — ABNORMAL LOW (ref 3.87–5.11)
RDW: 14.9 % (ref 11.5–15.5)
WBC: 20.6 K/uL — ABNORMAL HIGH (ref 4.0–10.5)
nRBC: 0 % (ref 0.0–0.2)

## 2024-01-11 MED ORDER — COCONUT OIL OIL: 1.0000 | TOPICAL_OIL | Status: DC | PRN

## 2024-01-11 MED ORDER — TETANUS-DIPHTH-ACELL PERTUSSIS 5-2-15.5 LF-MCG/0.5 IM SUSP
0.5000 mL | Freq: Once | INTRAMUSCULAR | Status: DC
  Filled 2024-01-11: qty 0.5

## 2024-01-11 MED ORDER — MENTHOL 3 MG MT LOZG: 1.0000 | LOZENGE | OROMUCOSAL | Status: DC | PRN

## 2024-01-11 MED ORDER — WITCH HAZEL-GLYCERIN EX PADS: 1.0000 | MEDICATED_PAD | CUTANEOUS | Status: DC | PRN

## 2024-01-11 MED ORDER — MEDROXYPROGESTERONE ACETATE 150 MG/ML IM SUSP: 150.0000 mg | INTRAMUSCULAR | Status: DC | PRN

## 2024-01-11 MED ORDER — DIBUCAINE (PERIANAL) 1 % EX OINT: 1.0000 | TOPICAL_OINTMENT | CUTANEOUS | Status: DC | PRN

## 2024-01-11 MED ORDER — MEASLES, MUMPS & RUBELLA VAC ~~LOC~~ SUSR
0.5000 mL | Freq: Once | SUBCUTANEOUS | Status: DC
  Filled 2024-01-11: qty 0.5

## 2024-01-11 MED ORDER — ACETAMINOPHEN 325 MG PO TABS
650.0000 mg | ORAL_TABLET | ORAL | Status: DC | PRN
  Administered 2024-01-12 – 2024-01-13 (×7): 650 mg via ORAL
  Filled 2024-01-11 (×7): qty 2

## 2024-01-11 MED ORDER — LACTATED RINGERS IV SOLN: INTRAVENOUS | Status: DC

## 2024-01-11 MED ORDER — GABAPENTIN 300 MG PO CAPS
300.0000 mg | ORAL_CAPSULE | Freq: Three times a day (TID) | ORAL | Status: DC
  Filled 2024-01-11 (×5): qty 1

## 2024-01-11 MED ORDER — SENNOSIDES-DOCUSATE SODIUM 8.6-50 MG PO TABS
2.0000 | ORAL_TABLET | Freq: Every day | ORAL | Status: DC
  Administered 2024-01-11 – 2024-01-13 (×3): 2 via ORAL
  Filled 2024-01-11 (×3): qty 2

## 2024-01-11 MED ORDER — OXYTOCIN-SODIUM CHLORIDE 30-0.9 UT/500ML-% IV SOLN: 2.5000 [IU]/h | INTRAVENOUS | Status: AC

## 2024-01-11 MED ORDER — DIPHENHYDRAMINE HCL 25 MG PO CAPS: 25.0000 mg | ORAL_CAPSULE | Freq: Four times a day (QID) | ORAL | Status: DC | PRN

## 2024-01-11 MED ORDER — SIMETHICONE 80 MG PO CHEW: 80.0000 mg | CHEWABLE_TABLET | ORAL | Status: DC | PRN

## 2024-01-11 MED ORDER — ENOXAPARIN SODIUM 40 MG/0.4ML IJ SOSY
40.0000 mg | PREFILLED_SYRINGE | INTRAMUSCULAR | Status: DC
  Administered 2024-01-11 – 2024-01-12 (×2): 40 mg via SUBCUTANEOUS
  Filled 2024-01-11 (×3): qty 0.4

## 2024-01-11 MED ORDER — SIMETHICONE 80 MG PO CHEW
80.0000 mg | CHEWABLE_TABLET | Freq: Three times a day (TID) | ORAL | Status: DC
  Administered 2024-01-11 – 2024-01-13 (×6): 80 mg via ORAL
  Filled 2024-01-11 (×6): qty 1

## 2024-01-11 MED ORDER — OXYCODONE HCL 5 MG PO TABS: 2.5000 mg | ORAL_TABLET | ORAL | Status: DC | PRN

## 2024-01-11 MED ORDER — OXYCODONE HCL 5 MG PO TABS: 5.0000 mg | ORAL_TABLET | ORAL | Status: DC | PRN

## 2024-01-11 MED ORDER — PRENATAL MULTIVITAMIN CH
1.0000 | ORAL_TABLET | Freq: Every day | ORAL | Status: DC
  Filled 2024-01-11 (×3): qty 1

## 2024-01-11 NOTE — Progress Notes (Signed)
 OB/GYN Faculty Attending Note  Post Op Day 1  Subjective: Patient is feeling okay. She reports moderately well controlled pain on PO pain meds. She is not ambulating well yet, has tried to get up a couple of times and has had some light-headedness. Foley out but hasn't voided yet. She is had some crackers last night, has not tried to eat yet today, denies nause. Bleeding is moderate. She is bottle feeding. Baby is in room and doing well.  Objective: Blood pressure 108/69, pulse 85, temperature 98 F (36.7 C), temperature source Oral, resp. rate 18, height 5' 4 (1.626 m), weight 82.8 kg, last menstrual period 03/23/2023, SpO2 98%, unknown if currently breastfeeding. Temp:  [97.1 F (36.2 C)-100.2 F (37.9 C)] 98 F (36.7 C) (12/06 1300) Pulse Rate:  [73-105] 85 (12/06 1300) Resp:  [16-20] 18 (12/06 1300) BP: (108-143)/(69-81) 108/69 (12/06 1300) SpO2:  [96 %-100 %] 98 % (12/06 1300)  Physical Exam:  General: alert, oriented, cooperative Chest: normal respiratory effort Heart: regular rate  Abdomen: soft, appropriately tender to palpation, incision covered by dressing with no evidence of active bleeding  Uterine Fundus: firm, at the level of the umbilicus Lochia: minimal, rubra DVT Evaluation: no evidence of DVT Extremities: no edema, no calf tenderness  UOP: due to void  Recent Labs    01/10/24 2319 01/11/24 0516  HGB 11.1* 10.6*  HCT 33.4* 31.2*    Assessment/Plan: Patient Active Problem List   Diagnosis Date Noted   Preeclampsia 01/09/2024   Indication for care in labor or delivery 01/08/2024   Marginal insertion of umbilical cord affecting management of mother 10/08/2023   Placenta succenturiata, third trimester 09/23/2023   Intrauterine synechiae 08/12/2023   UTI in pregnancy, antepartum, second trimester 07/12/2023   Supervision of high risk pregnancy, antepartum 06/18/2023   AMA (advanced maternal age) primigravida 35+ 06/14/2023   Hereditary hemochromatosis  06/14/2023   Factor 5 Leiden mutation, heterozygous 06/14/2023   History of ductal carcinoma in situ (DCIS) of breast 09/20/2017    Patient is 38 y.o. G1P1001 POD#1 s/p 1LTCS at [redacted]w[redacted]d for arrest of dilation with induction of labor. Course complicated by factor V Leiden, hereditary hemochromatosis, h/o lt breast cancer s/p bl mastectomy. She is doing well, recovering appropriately and complains only of some brief light-headedness with trying to stand. Post op CBC appropriate  Continue routine post partum care Pain meds prn Regular diet Lovenox  40 mg daily [place]---will need for 6 weeks pp   K. Yolanda Moats, MD, [facility] Attending Center for [employer] (Faculty Practice)  01/11/2024, 2:29 PM

## 2024-01-11 NOTE — Plan of Care (Signed)
  Problem: Education: Goal: Knowledge of General Education information will improve Description: Including pain rating scale, medication(s)/side effects and non-pharmacologic comfort measures Outcome: Progressing   Problem: Health Behavior/Discharge Planning: Goal: Ability to manage health-related needs will improve Outcome: Progressing   Problem: Clinical Measurements: Goal: Ability to maintain clinical measurements within normal limits will improve Outcome: Progressing Goal: Will remain free from infection Outcome: Progressing Goal: Diagnostic test results will improve Outcome: Progressing Goal: Respiratory complications will improve Outcome: Progressing Goal: Cardiovascular complication will be avoided Outcome: Progressing   Problem: Activity: Goal: Risk for activity intolerance will decrease Outcome: Progressing   Problem: Nutrition: Goal: Adequate nutrition will be maintained Outcome: Progressing   Problem: Coping: Goal: Level of anxiety will decrease Outcome: Progressing   Problem: Elimination: Goal: Will not experience complications related to bowel motility Outcome: Progressing Goal: Will not experience complications related to urinary retention Outcome: Progressing   Problem: Pain Managment: Goal: General experience of comfort will improve and/or be controlled Outcome: Progressing   Problem: Safety: Goal: Ability to remain free from injury will improve Outcome: Progressing   Problem: Skin Integrity: Goal: Risk for impaired skin integrity will decrease Outcome: Progressing   Problem: Education: Goal: Knowledge of Childbirth will improve Outcome: Progressing Goal: Ability to make informed decisions regarding treatment and plan of care will improve Outcome: Progressing Goal: Ability to state and carry out methods to decrease the pain will improve Outcome: Progressing Goal: Individualized Educational Video(s) Outcome: Progressing   Problem:  Coping: Goal: Ability to verbalize concerns and feelings about labor and delivery will improve Outcome: Progressing   Problem: Life Cycle: Goal: Ability to make normal progression through stages of labor will improve Outcome: Progressing Goal: Ability to effectively push during vaginal delivery will improve Outcome: Progressing   Problem: Role Relationship: Goal: Will demonstrate positive interactions with the child Outcome: Progressing   Problem: Safety: Goal: Risk of complications during labor and delivery will decrease Outcome: Progressing   Problem: Pain Management: Goal: Relief or control of pain from uterine contractions will improve Outcome: Progressing   Problem: Education: Goal: Knowledge of the prescribed therapeutic regimen will improve Outcome: Progressing Goal: Understanding of sexual limitations or changes related to disease process or condition will improve Outcome: Progressing Goal: Individualized Educational Video(s) Outcome: Progressing   Problem: Self-Concept: Goal: Communication of feelings regarding changes in body function or appearance will improve Outcome: Progressing   Problem: Skin Integrity: Goal: Demonstration of wound healing without infection will improve Outcome: Progressing   Problem: Education: Goal: Knowledge of condition will improve Outcome: Progressing Goal: Individualized Educational Video(s) Outcome: Progressing Goal: Individualized Newborn Educational Video(s) Outcome: Progressing   Problem: Activity: Goal: Will verbalize the importance of balancing activity with adequate rest periods Outcome: Progressing Goal: Ability to tolerate increased activity will improve Outcome: Progressing   Problem: Coping: Goal: Ability to identify and utilize available resources and services will improve Outcome: Progressing   Problem: Life Cycle: Goal: Chance of risk for complications during the postpartum period will decrease Outcome:  Progressing   Problem: Role Relationship: Goal: Ability to demonstrate positive interaction with newborn will improve Outcome: Progressing   Problem: Skin Integrity: Goal: Demonstration of wound healing without infection will improve Outcome: Progressing

## 2024-01-11 NOTE — Lactation Note (Signed)
 This note was copied from a baby's chart. Lactation Consultation Note  Patient Name: Sandy Sullivan Unijb'd Date: 01/11/2024 Age:36 hours   Mom doesn't need assistance from Lactation. Mom brought her own DBM. Mom had complete double mastectomy and breast reconstructive surgery. Mom is not able to BF.  Maternal Data    Feeding    LATCH Score                    Lactation Tools Discussed/Used    Interventions    Discharge    Consult Status Consult Status: Complete    Jaydon Soroka G 01/11/2024, 6:44 AM

## 2024-01-11 NOTE — Lactation Note (Addendum)
 This note was copied from a baby's chart. Lactation Consultation Note  Patient Name: Sandy Sullivan Unijb'd Date: 01/11/2024 Age:36 hours   Baby was crying a lot. LC done a lot of different things to get baby to stop crying. Finally by holding baby facing forward w/arm supporting on his stomach and bounding him, felt rumble in his stomach and he burped. Be stopped crying. Baby is getting DBM. Mom has 3 month supply right now. Asked mom when baby was crying if she wanted to hold him and she said no not right now. She stated they had all ready tried that and he didn't stop crying. Mom has had double Mastectomy. Mom wasn't feeling that good right now, tired and her legs are still kinda numb.   Maternal Data    Feeding    LATCH Score                    Lactation Tools Discussed/Used    Interventions    Discharge    Consult Status      Sandy Sullivan 01/11/2024, 5:51 AM

## 2024-01-12 ENCOUNTER — Encounter (HOSPITAL_COMMUNITY): Payer: Self-pay | Admitting: Obstetrics & Gynecology

## 2024-01-12 MED ORDER — NIFEDIPINE ER OSMOTIC RELEASE 30 MG PO TB24
30.0000 mg | ORAL_TABLET | Freq: Every day | ORAL | Status: DC
  Administered 2024-01-12 – 2024-01-13 (×2): 30 mg via ORAL
  Filled 2024-01-12 (×2): qty 1

## 2024-01-12 MED ORDER — FUROSEMIDE 20 MG PO TABS
20.0000 mg | ORAL_TABLET | Freq: Two times a day (BID) | ORAL | Status: DC
  Administered 2024-01-12 – 2024-01-13 (×2): 20 mg via ORAL
  Filled 2024-01-12 (×2): qty 1

## 2024-01-12 MED ORDER — POTASSIUM CHLORIDE CRYS ER 20 MEQ PO TBCR
20.0000 meq | EXTENDED_RELEASE_TABLET | Freq: Every day | ORAL | Status: DC
  Administered 2024-01-12 – 2024-01-13 (×2): 20 meq via ORAL
  Filled 2024-01-12 (×2): qty 1

## 2024-01-12 NOTE — Progress Notes (Signed)
 Pt's last two blood pressures 132/90 and 136/97. Pt has no signs or symptoms of preeclampsia. Dr. Nicholaus notified. Dr. Nicholaus advised to recheck pt's blood pressure in one hour.

## 2024-01-12 NOTE — Progress Notes (Signed)
 Patient has had 3 BP > 130/85, will initiate anti-hypertensives. Procardia , lasix  and potassium  K. Sandy Moats, MD, [facility] Attending Center for [facility])

## 2024-01-12 NOTE — Progress Notes (Signed)
 OB/GYN Faculty Attending Note  Post Op Day 2  Subjective: Patient is feeling better today. She reports moderately well controlled pain on PO pain meds, taking tylenol  only because she doesn't want to take many meds if she can avoid it. She is ambulating and denies light-headedness or dizziness, had one brief episode this am that resolved quickly. She is voiding spontaneously. She is passing flatus, has not had a BM yet. She is tolerating a regular diet without nausea/vomiting. Bleeding is moderate. She is bottle feeding. Baby is in room and doing very well.  Objective: Blood pressure (!) 132/90, pulse 88, temperature 98.2 F (36.8 C), temperature source Oral, resp. rate 18, height 5' 4 (1.626 m), weight 82.8 kg, last menstrual period 03/23/2023, SpO2 100%, unknown if currently breastfeeding. Temp:  [98.1 F (36.7 C)-98.3 F (36.8 C)] 98.2 F (36.8 C) (12/07 1500) Pulse Rate:  [82-99] 88 (12/07 1500) Resp:  [18-20] 18 (12/07 1500) BP: (114-132)/(74-90) 132/90 (12/07 1500) SpO2:  [100 %] 100 % (12/07 1500)  Physical Exam:  General: alert, oriented, cooperative Chest: normal respiratory effort Heart: regular rate  Abdomen: soft, appropriately tender to palpation, incision covered by dressing with no evidence of active bleeding  Uterine Fundus: firm, at the level of the the umbilicus Lochia: moderate, rubra DVT Evaluation: no evidence of DVT Extremities: +2 edema, no calf tenderness  UOP: voiding spontaneously  Recent Labs    01/10/24 2319 01/11/24 0516  HGB 11.1* 10.6*  HCT 33.4* 31.2*    Assessment/Plan: Patient Active Problem List   Diagnosis Date Noted   Preeclampsia 01/09/2024   Indication for care in labor or delivery 01/08/2024   Marginal insertion of umbilical cord affecting management of mother 10/08/2023   Placenta succenturiata, third trimester 09/23/2023   Intrauterine synechiae 08/12/2023   UTI in pregnancy, antepartum, second trimester 07/12/2023    Supervision of high risk pregnancy, antepartum 06/18/2023   AMA (advanced maternal age) primigravida 35+ 06/14/2023   Hereditary hemochromatosis 06/14/2023   Factor 5 Leiden mutation, heterozygous 06/14/2023   History of ductal carcinoma in situ (DCIS) of breast 09/20/2017    Patient is 38 y.o. G1P1001 POD#2 s/p 1LTCS at [redacted]w[redacted]d for arrest of dilation with induction of labor. Course complicated by mild pre-eclampsia, factor V Leiden, herediatry hemochromatosis and h/o lt breast cancer sp bl mastectomy. She is doing well, recovering appropriately and complains only of some pain and swelling. BP has been normal pp with one elevated BP this afternoon   Cont to monitor BP, will start antihypertensives prn Continue routine post partum care Pain meds prn Regular diet Lovenox  40 mg daily [place]--will need 6 weeks pp Plan for discharge likely tomorrow    K. Yolanda Moats, MD, [facility] Attending Center for [employer] (Faculty Practice)  01/12/2024, 4:25 PM

## 2024-01-12 NOTE — Progress Notes (Signed)
 Patient seen at bedside re: antihypertensive medications. Discussed postpartum hypertension, postpartum pre-eclampsia and eclampsia and reasoning behind starting medications. Answered questions extensively, including side effects and timeline for medications. All questions answered, patient comfortable with taking antihypertensives.  Sandy DELENA Courts, MD 9:43 PM

## 2024-01-12 NOTE — Anesthesia Postprocedure Evaluation (Signed)
 Anesthesia Post Note  Patient: Chemical Engineer  Procedure(s) Performed: CESAREAN DELIVERY     Patient location during evaluation: PACU Anesthesia Type: Spinal Level of consciousness: awake and alert Pain management: pain level controlled Vital Signs Assessment: post-procedure vital signs reviewed and stable Respiratory status: spontaneous breathing and respiratory function stable Cardiovascular status: blood pressure returned to baseline and stable Postop Assessment: spinal receding Anesthetic complications: no   No notable events documented.  Last Vitals:  Vitals:   01/11/24 2141 01/12/24 0614  BP: 114/74 128/83  Pulse: 99 82  Resp: 18 20  Temp: 36.7 C 36.8 C  SpO2: 100% 100%    Last Pain:  Vitals:   01/12/24 0743  TempSrc:   PainSc: 2    Pain Goal:                Epidural/Spinal Function Cutaneous sensation: Normal sensation (01/12/24 0743)  Epifanio Lamar BRAVO

## 2024-01-13 ENCOUNTER — Other Ambulatory Visit (HOSPITAL_COMMUNITY): Payer: Self-pay

## 2024-01-13 MED ORDER — FUROSEMIDE 20 MG PO TABS
20.0000 mg | ORAL_TABLET | Freq: Two times a day (BID) | ORAL | 0 refills | Status: DC
  Filled 2024-01-13: qty 10, 5d supply, fill #0

## 2024-01-13 MED ORDER — POTASSIUM CHLORIDE CRYS ER 20 MEQ PO TBCR
20.0000 meq | EXTENDED_RELEASE_TABLET | Freq: Every day | ORAL | 0 refills | Status: DC
  Filled 2024-01-13: qty 5, 5d supply, fill #0

## 2024-01-13 MED ORDER — SENNOSIDES-DOCUSATE SODIUM 8.6-50 MG PO TABS
2.0000 | ORAL_TABLET | Freq: Every day | ORAL | 0 refills | Status: DC
  Filled 2024-01-13: qty 30, 15d supply, fill #0

## 2024-01-13 MED ORDER — OXYCODONE HCL 5 MG PO TABS
2.5000 mg | ORAL_TABLET | ORAL | 0 refills | Status: DC | PRN
  Filled 2024-01-13: qty 5, 2d supply, fill #0

## 2024-01-13 MED ORDER — NIFEDIPINE ER 30 MG PO TB24
30.0000 mg | ORAL_TABLET | Freq: Every day | ORAL | 0 refills | Status: DC
  Filled 2024-01-13: qty 30, 30d supply, fill #0

## 2024-01-13 MED ORDER — ENOXAPARIN SODIUM 40 MG/0.4ML IJ SOSY
40.0000 mg | PREFILLED_SYRINGE | INTRAMUSCULAR | 0 refills | Status: DC
  Filled 2024-01-13: qty 12, 30d supply, fill #0

## 2024-01-13 MED ORDER — SIMETHICONE 80 MG PO CHEW
80.0000 mg | CHEWABLE_TABLET | Freq: Three times a day (TID) | ORAL | 0 refills | Status: DC
  Filled 2024-01-13: qty 30, 10d supply, fill #0

## 2024-01-13 MED ORDER — ACETAMINOPHEN 325 MG PO TABS
650.0000 mg | ORAL_TABLET | ORAL | 0 refills | Status: AC | PRN
  Filled 2024-01-13: qty 90, 8d supply, fill #0

## 2024-01-14 ENCOUNTER — Encounter: Payer: Self-pay | Admitting: Obstetrics and Gynecology

## 2024-01-14 ENCOUNTER — Telehealth: Payer: Self-pay

## 2024-01-14 NOTE — Telephone Encounter (Signed)
 Patient called in to discuss her blood pressure medication. She was given Nifedipine  30mg  at the hospital. She took her dose at home this morning and complains of dizziness, high heart rate, itchy throat, and feeling like she has to take a deep breath after standing or walking. She was able to take her oxygen and states it was 95.

## 2024-01-15 MED ORDER — LISINOPRIL 10 MG PO TABS: 10.0000 mg | ORAL_TABLET | Freq: Every day | ORAL | 1 refills | Status: DC

## 2024-01-15 NOTE — Telephone Encounter (Signed)
 Per Dr. Fredirick, send Lisinopril 10mg  to take daily and DC nifedipine   I informed patient of the medication change. Patient verbally understood

## 2024-01-18 ENCOUNTER — Telehealth (HOSPITAL_COMMUNITY): Payer: Self-pay

## 2024-01-18 NOTE — Telephone Encounter (Addendum)
 01/18/2024 [id number]  Name: Isyss Espinal MRN: [id number] DOB: 05/23/1985  Reason for Call:  Transition of Care Hospital Discharge Call  Contact Status:Message    Language assistant needed:          Follow-Up Questions:    [place] Postnatal Depression Scale:  In the Past 7 Days:    PHQ2-9 Depression Scale:     Discharge Follow-up:    Post-discharge interventions: NA  Signature  Rosaline Deretha PEAK

## 2024-01-20 ENCOUNTER — Ambulatory Visit

## 2024-01-20 NOTE — Progress Notes (Signed)
 Subjective:     Sandy Sullivan is a 38 y.o. female who presents to the clinic 2 weeks status post low uterine, transverse cesarean section. Pt reports incision is healing well.      Objective:    LMP 03/23/2023  General:  alert, well appearing, in no apparent distress  Incision:   healing well, no drainage, no erythema, no hernia, no seroma, no swelling, no dehiscence, incision well approximated     Assessment:    Doing well postoperatively.   Plan:    1. Continue any current medications. 2. Wound care discussed. 3. Follow up: at post partum visit.   Sullivan Beams, CMA

## 2024-02-06 ENCOUNTER — Other Ambulatory Visit: Payer: Self-pay | Admitting: Family Medicine

## 2024-02-06 ENCOUNTER — Encounter: Payer: Self-pay | Admitting: Family Medicine

## 2024-02-06 DIAGNOSIS — D6851 Activated protein C resistance: Secondary | ICD-10-CM

## 2024-02-07 ENCOUNTER — Other Ambulatory Visit: Payer: Self-pay | Admitting: Family Medicine

## 2024-02-07 DIAGNOSIS — D6851 Activated protein C resistance: Secondary | ICD-10-CM

## 2024-02-07 MED ORDER — ENOXAPARIN SODIUM 40 MG/0.4ML IJ SOSY: 40.0000 mg | PREFILLED_SYRINGE | INTRAMUSCULAR | 0 refills | Status: DC

## 2024-02-20 ENCOUNTER — Ambulatory Visit: Admitting: Family Medicine

## 2024-02-20 VITALS — BP 124/86 | HR 70 | Wt 162.0 lb

## 2024-02-20 DIAGNOSIS — R102 Pelvic and perineal pain unspecified side: Secondary | ICD-10-CM | POA: Diagnosis not present

## 2024-02-20 NOTE — Progress Notes (Signed)
 "   Post Partum Visit Note  Sandy Sullivan is a 39 y.o. G76P1001 female who presents for a postpartum visit. She is 6 weeks postpartum following a primary cesarean section.  I have fully reviewed the prenatal and intrapartum course. The delivery was at 41 gestational weeks.  Anesthesia: spinal. Postpartum course has been 41.6. Baby is doing well. Baby is feeding by breast. Bleeding thin lochia. Bowel function is normal. Bladder function is normal. Patient is not sexually active. Contraception method is none.  Has some muscle soreness. Throbbing pain at top of pubis and in vagina--also with stabbing pain. Worse with walking.  Postpartum depression screening: negative.   The pregnancy intention screening data noted above was reviewed. Potential methods of contraception were discussed. The patient elected to proceed with No data recorded.   Edinburgh Postnatal Depression Scale - 02/20/24 1359       Edinburgh Postnatal Depression Scale:  In the Past 7 Days   I have been able to laugh and see the funny side of things. 0    I have looked forward with enjoyment to things. 0    I have blamed myself unnecessarily when things went wrong. 0    I have been anxious or worried for no good reason. 0    I have felt scared or panicky for no good reason. 0    Things have been getting on top of me. 0    I have been so unhappy that I have had difficulty sleeping. 0    I have felt sad or miserable. 0    I have been so unhappy that I have been crying. 0    The thought of harming myself has occurred to me. 0    Edinburgh Postnatal Depression Scale Total 0          Health Maintenance Due  Topic Date Due   COVID-19 Vaccine (1) Never done   DTaP/Tdap/Td (1 - Tdap) Never done   Hepatitis B Vaccines 19-59 Average Risk (1 of 3 - 19+ 3-dose series) Never done   Influenza Vaccine  Never done    The following portions of the patient's history were reviewed and updated as appropriate: allergies, current  medications, past family history, past medical history, past social history, past surgical history, and problem list.  Review of Systems Pertinent items noted in HPI and remainder of comprehensive ROS otherwise negative.  Objective:  BP 124/86   Pulse 70   Wt 162 lb (73.5 kg)   LMP 03/23/2023   Breastfeeding Yes   BMI 27.81 kg/m    General:  alert, cooperative, and appears stated age  Lungs: Normal effort  Heart:  regular rate and rhythm  Abdomen: soft, non-tender; bowel sounds normal; no masses,  no organomegaly   Wound well approximated incision  GU exam:  not indicated       Assessment:    Normal postpartum exam.   Plan:   Essential components of care per ACOG recommendations:  1.  Mood and well being: Patient with negative depression screening today. Reviewed local resources for support.  - Patient tobacco use? No.   - hx of drug use? No.    2. Infant care and feeding:  -Patient currently breastmilk feeding? No.  -Social determinants of health (SDOH) reviewed in EPIC. No concerns  3. Sexuality, contraception and birth spacing - Patient does not want a pregnancy in the next year.  Desired family size is 2 children.  - Reviewed reproductive life planning. Reviewed contraceptive  methods based on pt preferences and effectiveness.  Patient desired Withdrawal or Other Method today.   - Discussed birth spacing of 18 months  4. Sleep and fatigue -Encouraged family/partner/community support of 4 hrs of uninterrupted sleep to help with mood and fatigue  5. Physical Recovery  - Discussed patients delivery and complications. She describes her labor as mixed. - Patient had a C-section failure to progress.Perineal healing reviewed. Patient expressed understanding - Patient has urinary incontinence? No. - Patient is safe to resume physical and sexual activity  6.  Health Maintenance - HM due items addressed Yes - Last pap smear  Diagnosis  Date Value Ref Range Status   10/28/2019   Final   - Negative for intraepithelial lesion or malignancy (NILM)   Pap smear not done at today's visit.  -Breast Cancer screening indicated? No.   7. Chronic Disease/Pregnancy Condition follow up: Hypertension  - PCP follow up  Sandy GORMAN Birk, MD [facility], [facility]  "

## 2024-02-20 NOTE — Patient Instructions (Signed)
 Warren Pals, RN [phone] [address] [facility] [place], [place] [address]

## 2024-03-05 ENCOUNTER — Inpatient Hospital Stay: Attending: Oncology

## 2024-03-05 DIAGNOSIS — Z148 Genetic carrier of other disease: Secondary | ICD-10-CM

## 2024-03-05 DIAGNOSIS — D6851 Activated protein C resistance: Secondary | ICD-10-CM

## 2024-03-05 DIAGNOSIS — Z86000 Personal history of in-situ neoplasm of breast: Secondary | ICD-10-CM

## 2024-03-05 LAB — HEPATIC FUNCTION PANEL
ALT: 22 U/L (ref 0–44)
AST: 20 U/L (ref 15–41)
Albumin: 4.2 g/dL (ref 3.5–5.0)
Alkaline Phosphatase: 67 U/L (ref 38–126)
Bilirubin, Direct: 0.2 mg/dL (ref 0.0–0.2)
Indirect Bilirubin: 0.3 mg/dL (ref 0.3–0.9)
Total Bilirubin: 0.4 mg/dL (ref 0.0–1.2)
Total Protein: 6.8 g/dL (ref 6.5–8.1)

## 2024-03-05 LAB — CBC (CANCER CENTER ONLY)
HCT: 39.6 % (ref 36.0–46.0)
Hemoglobin: 13 g/dL (ref 12.0–15.0)
MCH: 28.1 pg (ref 26.0–34.0)
MCHC: 32.8 g/dL (ref 30.0–36.0)
MCV: 85.7 fL (ref 80.0–100.0)
Platelet Count: 243 10*3/uL (ref 150–400)
RBC: 4.62 MIL/uL (ref 3.87–5.11)
RDW: 14.1 % (ref 11.5–15.5)
WBC Count: 6.5 10*3/uL (ref 4.0–10.5)
nRBC: 0 % (ref 0.0–0.2)

## 2024-03-05 LAB — FERRITIN: Ferritin: 18 ng/mL (ref 11–307)

## 2024-03-05 LAB — IRON AND TIBC
Iron: 42 ug/dL (ref 28–170)
Saturation Ratios: 11 % (ref 10.4–31.8)
TIBC: 392 ug/dL (ref 250–450)
UIBC: 350 ug/dL

## 2024-03-09 ENCOUNTER — Inpatient Hospital Stay: Admitting: Oncology

## 2024-03-09 ENCOUNTER — Encounter: Payer: Self-pay | Admitting: Oncology

## 2024-03-09 DIAGNOSIS — D6851 Activated protein C resistance: Secondary | ICD-10-CM

## 2024-03-09 DIAGNOSIS — Z86000 Personal history of in-situ neoplasm of breast: Secondary | ICD-10-CM

## 2024-03-09 NOTE — Assessment & Plan Note (Signed)
 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 Continue follow-up with [facility].

## 2024-03-09 NOTE — Assessment & Plan Note (Signed)
 Heterozygous factor V Leiden mutation, no personal history of thrombosis.  family history of DVT. Patient had Lovenox  prophylaxis after C-section.

## 2024-03-09 NOTE — Assessment & Plan Note (Signed)
 Heterozygous S65C hemochromatosis mutation. Lab Results  Component Value Date   HGB 13.0 03/05/2024   TIBC 392 03/05/2024   IRONPCTSAT 11 03/05/2024   FERRITIN 18 03/05/2024    She has a history of elevated ferritin, normal iron saturation. Today's iron panel showed normal ferritin level and iron saturation. No signs of iron overload. No need for any treatments.  Continue monitor annually.

## 2024-03-11 ENCOUNTER — Encounter (HOSPITAL_COMMUNITY): Payer: Self-pay | Admitting: Obstetrics & Gynecology

## 2024-04-08 ENCOUNTER — Ambulatory Visit

## 2025-03-09 ENCOUNTER — Inpatient Hospital Stay

## 2025-03-11 ENCOUNTER — Inpatient Hospital Stay: Admitting: Oncology
